# Patient Record
Sex: Female | Born: 1960
Health system: Southern US, Community
[De-identification: ages and names within clinical notes are randomized; demographics above are authoritative.]

## PROBLEM LIST (undated history)

## (undated) DIAGNOSIS — M1990 Unspecified osteoarthritis, unspecified site: Secondary | ICD-10-CM

## (undated) DIAGNOSIS — K91840 Postprocedural hemorrhage and hematoma of a digestive system organ or structure following a digestive system procedure: Secondary | ICD-10-CM

## (undated) DIAGNOSIS — I10 Essential (primary) hypertension: Secondary | ICD-10-CM

## (undated) DIAGNOSIS — T7840XA Allergy, unspecified, initial encounter: Secondary | ICD-10-CM

## (undated) HISTORY — DX: Unspecified osteoarthritis, unspecified site: M19.90

## (undated) HISTORY — PX: COLONOSCOPY: SHX174

## (undated) HISTORY — DX: Allergy, unspecified, initial encounter: T78.40XA

## (undated) HISTORY — PX: OTHER SURGICAL HISTORY: SHX169

## (undated) HISTORY — PX: INCONTINENCE SURGERY: SHX676

## (undated) HISTORY — DX: Essential (primary) hypertension: I10

---
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
## 2005-05-28 HISTORY — PX: BLADDER REPAIR: SHX76

## 2013-10-23 ENCOUNTER — Encounter: Payer: Self-pay | Admitting: Physician Assistant

## 2013-10-23 ENCOUNTER — Ambulatory Visit (INDEPENDENT_AMBULATORY_CARE_PROVIDER_SITE_OTHER): Payer: 59 | Admitting: Physician Assistant

## 2013-10-23 VITALS — BP 143/78 | HR 70 | Ht 62.0 in | Wt 165.0 lb

## 2013-10-23 DIAGNOSIS — F329 Major depressive disorder, single episode, unspecified: Secondary | ICD-10-CM

## 2013-10-23 DIAGNOSIS — I10 Essential (primary) hypertension: Secondary | ICD-10-CM

## 2013-10-23 DIAGNOSIS — F32A Depression, unspecified: Secondary | ICD-10-CM

## 2013-10-23 DIAGNOSIS — J309 Allergic rhinitis, unspecified: Secondary | ICD-10-CM

## 2013-10-23 DIAGNOSIS — F439 Reaction to severe stress, unspecified: Secondary | ICD-10-CM

## 2013-10-23 DIAGNOSIS — Z1211 Encounter for screening for malignant neoplasm of colon: Secondary | ICD-10-CM

## 2013-10-23 DIAGNOSIS — F3289 Other specified depressive episodes: Secondary | ICD-10-CM

## 2013-10-23 MED ORDER — ESCITALOPRAM OXALATE 10 MG PO TABS: 10.0000 mg | ORAL_TABLET | Freq: Every day | ORAL | Status: DC

## 2013-10-23 MED ORDER — HYDROCHLOROTHIAZIDE 12.5 MG PO TABS: 12.5000 mg | ORAL_TABLET | Freq: Every day | ORAL | Status: DC

## 2013-10-23 MED ORDER — CONJ ESTROG-MEDROXYPROGEST ACE 0.625-2.5 MG PO TABS: 1.0000 | ORAL_TABLET | Freq: Every day | ORAL | Status: DC

## 2013-10-23 NOTE — Patient Instructions (Signed)
belviq twice a day.  Qsymia once day.  Contrave twice a day.  Phentermine once a day. Cheapest.   Follow up in 4-6 weeks.

## 2013-10-26 DIAGNOSIS — F329 Major depressive disorder, single episode, unspecified: Secondary | ICD-10-CM | POA: Insufficient documentation

## 2013-10-26 DIAGNOSIS — F32A Depression, unspecified: Secondary | ICD-10-CM | POA: Insufficient documentation

## 2013-10-26 DIAGNOSIS — F439 Reaction to severe stress, unspecified: Secondary | ICD-10-CM | POA: Insufficient documentation

## 2013-10-26 DIAGNOSIS — I10 Essential (primary) hypertension: Secondary | ICD-10-CM | POA: Insufficient documentation

## 2013-10-26 DIAGNOSIS — J309 Allergic rhinitis, unspecified: Secondary | ICD-10-CM | POA: Insufficient documentation

## 2013-10-26 NOTE — Progress Notes (Signed)
Subjective:    Patient ID: Melanie Hart, female    DOB: [birth year], 53 y.o.   MRN: [id number]  HPI Patient is a 53 year old female who presents to the clinic to establish care.  .. Active Ambulatory Problems    Diagnosis Date Noted  . Depression 10/26/2013  . Stress at home 10/26/2013  . Essential hypertension, benign 10/26/2013  . Allergic rhinitis 10/26/2013   Resolved Ambulatory Problems    Diagnosis Date Noted  . No Resolved Ambulatory Problems   No Additional Past Medical History   .[name] Family History  Problem Relation Age of Onset  . Hypertension Mother   . Hypertension Sister   . Hyperlipidemia Brother   . Hypertension Brother    .[name] History   Social History  . Marital Status: Married    Spouse Name: N/A    Number of Children: N/A  . Years of Education: N/A   Occupational History  . Not on file.   Social History Main Topics  . Smoking status: Never Smoker   . Smokeless tobacco: Not on file  . Alcohol Use: No  . Drug Use: No  . Sexual Activity: Yes   Other Topics Concern  . Not on file   Social History Narrative  . No narrative on file   Allergic rhinitis-controlled with headache, Flonase and Claritin.  Depression/stress at home-couple years ago patient was struggling with depression and stress. She was put on at the same time estrogen and Lexapro. She has been much improved for the last 2 years. She would like to get off these medications at some point.  Hypertension-she has not currently been on any medication for hypertension. Her blood pressures have been borderline for over 6 months now. She denies any chest pains, palpitations, headaches or vision changes. She is aware that symptoms most likely needs to be done in any future.     Review of Systems  All other systems reviewed and are negative.      Objective:   Physical Exam  Constitutional: She is oriented to person, place, and time. She appears well-developed and well-nourished.   Obese.   HENT:  Head: Normocephalic and atraumatic.  Cardiovascular: Normal rate, regular rhythm and normal heart sounds.   Pulmonary/Chest: Effort normal and breath sounds normal.  Neurological: She is alert and oriented to person, place, and time.  Skin: Skin is dry.  Psychiatric: She has a normal mood and affect. Her behavior is normal.          Assessment & Plan:  Hypertension-blood pressure was slightly elevated today. Patient reported this has been ongoing for a couple months. Will start HCTZ 12.5 mg daily. Will recheck in 4-6 weeks. Patient aware she will urinate more frequently.  Stress at home/depression-controlled at this point. PHQ-9 was 0/2. Refilled Lexapro 10 mg daily. Patient did mention tapering off this medication. Discuss cutting in half and taking one half tab daily for 4-6 weeks. If no change in mood then to stop Lexapro. Patient was also put on estrogen at this time. She is only been on for 2 years. I suggested a least one more year of estrogen replacement then a trial off. If she is hoping to get off both medications I would try one at a time. She would like to try the Lexapro first.  Allergic rhinitis-patient did not need refills today however she was instructed to continue on Flonase and had a day as well as Claritin.  Obesity/abnormal weight gain-discussed options for weight loss. Contrave, belviq,  phentermine, qysmia were discussed. Patient aware of importance of being ready to start these medications for diet and exercise. 1200-calorie diet was discussed along with exercising at least 4-5 times a week. We will discuss in more depth at next visit in 4-6 weeks. I gave her information on all of these options.  Ordered colonoscopy. Patient is aware she needs a Pap smear.

## 2013-11-24 ENCOUNTER — Ambulatory Visit: Payer: 59 | Admitting: Physician Assistant

## 2013-11-24 ENCOUNTER — Encounter: Payer: Self-pay | Admitting: Internal Medicine

## 2013-12-02 ENCOUNTER — Ambulatory Visit (INDEPENDENT_AMBULATORY_CARE_PROVIDER_SITE_OTHER): Payer: 59 | Admitting: Physician Assistant

## 2013-12-02 ENCOUNTER — Encounter: Payer: Self-pay | Admitting: Physician Assistant

## 2013-12-02 VITALS — BP 140/77 | HR 74 | Ht 62.0 in | Wt 166.0 lb

## 2013-12-02 DIAGNOSIS — R635 Abnormal weight gain: Secondary | ICD-10-CM

## 2013-12-02 DIAGNOSIS — E669 Obesity, unspecified: Secondary | ICD-10-CM

## 2013-12-02 DIAGNOSIS — I10 Essential (primary) hypertension: Secondary | ICD-10-CM

## 2013-12-02 MED ORDER — NALTREXONE-BUPROPION HCL ER 8-90 MG PO TB12
ORAL_TABLET | ORAL | Status: DC
Start: 2013-12-02 — End: 2014-01-26

## 2013-12-02 NOTE — Progress Notes (Signed)
   Subjective:    Patient ID: Melanie Hart, female    DOB: 1960-08-17, 53 y.o.   MRN: [id number]  HPI Patient is a 53 year old female who presents to the clinic to followup on hypertension. She was started on HCTZ 12.5 mg daily for blood pressure. She has not noticed any changes in urination or side effects. She has not been checking her blood pressures. She denies any chest pain, palpitations, headaches or vision changes.  She would really like to be on something for weight loss. She continues to gain weight and wants to eat healthy weight. She also felt slightly loss could decrease her blood pressure so she does not have to stay on medication. She is currently not doing anything for diet management or exercise.     Review of Systems  All other systems reviewed and are negative.      Objective:   Physical Exam  Constitutional: She is oriented to person, place, and time. She appears well-developed and well-nourished.  HENT:  Head: Normocephalic and atraumatic.  Cardiovascular: Normal rate, regular rhythm and normal heart sounds.   Pulmonary/Chest: Effort normal and breath sounds normal.  Neurological: She is alert and oriented to person, place, and time.  Skin: Skin is dry.  Psychiatric: She has a normal mood and affect. Her behavior is normal.          Assessment & Plan:  HTN- pt is borderline hypertensive that did not improve with HCTZ. Stopped HCTZ. Encouraged pt to keep a log of BP over next 2 weeks and fax numbers in. Will decided at that point if needs to try another medication. She did not like lisinopril last time she took it. She is hoping to start losing weight and she feels like her BP might decrease accordingly.   Obesity/abnormal weight gain-we discussed all of long term weight loss drugs on the market. I do not think phentermine would be a good choice for patient with a side effect of increased blood pressure and heart rate. Patient has not decided together on contrave  since pt suffers a lot with cravings. Patient was educated on tapering up of medication. Side effects were discussed. Handout was given with information on drug. Patient was encouraged to followup in 2 months to discuss outcomes. Encouraged patient to incorporate healthy diet of around 1500 calories a day along with regular exercise that causes her to be out of breath.  Spent 30 minutes with patient greater than 50% of visit spent counseling patient regarding her treatment plan for weight loss.

## 2014-01-26 ENCOUNTER — Ambulatory Visit (AMBULATORY_SURGERY_CENTER): Payer: Self-pay

## 2014-01-26 VITALS — Ht 62.0 in | Wt 164.4 lb

## 2014-01-26 DIAGNOSIS — Z83719 Family history of colon polyps, unspecified: Secondary | ICD-10-CM

## 2014-01-26 DIAGNOSIS — Z8371 Family history of colonic polyps: Secondary | ICD-10-CM

## 2014-01-26 MED ORDER — MOVIPREP 100 G PO SOLR: 1.0000 | Freq: Once | ORAL | Status: DC

## 2014-01-26 NOTE — Progress Notes (Signed)
No allergies to eggs or soy No past problems with anesthesia No home oxygen No diet/weight loss med  Has email  Emmi instructions given for colonoscopy

## 2014-02-12 ENCOUNTER — Ambulatory Visit (AMBULATORY_SURGERY_CENTER): Payer: 59 | Admitting: Internal Medicine

## 2014-02-12 ENCOUNTER — Encounter: Payer: Self-pay | Admitting: Internal Medicine

## 2014-02-12 VITALS — BP 164/101 | HR 66 | Temp 97.4°F | Resp 23 | Ht 62.0 in | Wt 164.0 lb

## 2014-02-12 DIAGNOSIS — Z1211 Encounter for screening for malignant neoplasm of colon: Secondary | ICD-10-CM

## 2014-02-12 DIAGNOSIS — Z8371 Family history of colonic polyps: Secondary | ICD-10-CM

## 2014-02-12 DIAGNOSIS — D120 Benign neoplasm of cecum: Secondary | ICD-10-CM

## 2014-02-12 DIAGNOSIS — D126 Benign neoplasm of colon, unspecified: Secondary | ICD-10-CM

## 2014-02-12 MED ORDER — SODIUM CHLORIDE 0.9 % IV SOLN: 500.0000 mL | INTRAVENOUS | Status: DC

## 2014-02-12 NOTE — Op Note (Signed)
[facility]  [address] [place], [id number]   COLONOSCOPY PROCEDURE REPORT  PATIENT: Melanie Hart, Melanie Hart  MR#: [id number] BIRTHDATE: 1960/09/16 , 52  yrs. old GENDER: Female ENDOSCOPIST: Jerene Bears, MD REFERRED BY: Iran Planas, PA-C PROCEDURE DATE:  02/12/2014 PROCEDURE:   Colonoscopy with snare polypectomy First Screening Colonoscopy - Avg.  risk and is 50 yrs.  old or older - No.  Prior Negative Screening - Now for repeat screening. N/A  History of Adenoma - Now for follow-up colonoscopy & has been > or = to 3 yrs.  N/A  Polyps Removed Today? Yes. ASA CLASS:   Class II INDICATIONS:average risk screening and first colonoscopy.   family history of colon polyp (mother) MEDICATIONS: MAC sedation, administered by CRNA and propofol (Diprivan) 300mg  IV  DESCRIPTION OF PROCEDURE:   After the risks benefits and alternatives of the procedure were thoroughly explained, informed consent was obtained.  A digital rectal exam revealed no rectal mass.   The LB [id number]-[id number]  endoscope was introduced through the anus and advanced to the cecum, which was identified by both the appendix and ileocecal valve. No adverse events experienced. The quality of the prep was good, using MoviPrep  The instrument was then slowly withdrawn as the colon was fully examined.  COLON FINDINGS: A flat polyp measuring 5-6 mm in size was found at the cecum.  A polypectomy was performed with a cold snare.  The resection was complete and the polyp tissue was completely retrieved.   There was moderate diverticulosis noted in the ascending colon, transverse colon, descending colon, and sigmoid colon with associated muscular hypertrophy.  Retroflexed views revealed internal hemorrhoids. The time to cecum=4 minutes 35 seconds.  Withdrawal time=21 minutes 27 seconds.  The scope was withdrawn and the procedure completed. COMPLICATIONS: There were no complications.  ENDOSCOPIC IMPRESSION: 1.   Flat  polyp measuring 5-6 mm in size was found at the cecum; polypectomy was performed with a cold snare 2.   There was moderate diverticulosis noted in the ascending colon, transverse colon, descending colon, and sigmoid colon  RECOMMENDATIONS: 1.  Await pathology results 2.  High fiber diet 3.  Timing of repeat colonoscopy will be determined by pathology findings. 4.  You will receive a letter within 1-2 weeks with the results of your biopsy as well as final recommendations.  Please call my office if you have not received a letter after 3 weeks.   eSigned:  Jerene Bears, MD 02/12/2014 9:20 AM   cc: The Patient and Iran Planas PA-C

## 2014-02-12 NOTE — Progress Notes (Signed)
Patient states that she is aware of her increased bp.  She states that she is watching it with her PCP and exercising regularly.  She will follow-up with her PCP soon.

## 2014-02-12 NOTE — Patient Instructions (Signed)
YOU HAD AN ENDOSCOPIC PROCEDURE TODAY AT [facility]: Refer to the procedure report that was given to you for any specific questions about what was found during the examination.  If the procedure report does not answer your questions, please call your gastroenterologist to clarify.  If you requested that your care partner not be given the details of your procedure findings, then the procedure report has been included in a sealed envelope for you to review at your convenience later.  YOU SHOULD EXPECT: Some feelings of bloating in the abdomen. Passage of more gas than usual.  Walking can help get rid of the air that was put into your GI tract during the procedure and reduce the bloating. If you had a lower endoscopy (such as a colonoscopy or flexible sigmoidoscopy) you may notice spotting of blood in your stool or on the toilet paper. If you underwent a bowel prep for your procedure, then you may not have a normal bowel movement for a few days.  DIET: Your first meal following the procedure should be a light meal and then it is ok to progress to your normal diet.  A half-sandwich or bowl of soup is an example of a good first meal.  Heavy or fried foods are harder to digest and may make you feel nauseous or bloated.  Likewise meals heavy in dairy and vegetables can cause extra gas to form and this can also increase the bloating.  Drink plenty of fluids but you should avoid alcoholic beverages for 24 hours.Try to eat a high fiber diet due to extensive amounts of Diverticulosis.    ACTIVITY: Your care partner should take you home directly after the procedure.  You should plan to take it easy, moving slowly for the rest of the day.  You can resume normal activity the day after the procedure however you should NOT DRIVE or use heavy machinery for 24 hours (because of the sedation medicines used during the test).    SYMPTOMS TO REPORT IMMEDIATELY: A gastroenterologist can be reached at any hour.   During normal business hours, 8:30 AM to 5:00 PM Monday through Friday, call [phone].  After hours and on weekends, please call the GI answering service at [phone] who will take a message and have the physician on call contact you.   Following lower endoscopy (colonoscopy or flexible sigmoidoscopy):  Excessive amounts of blood in the stool  Significant tenderness or worsening of abdominal pains  Swelling of the abdomen that is new, acute  Fever of 100F or higher  FOLLOW UP: If any biopsies were taken you will be contacted by phone or by letter within the next 1-3 weeks.  Call your gastroenterologist if you have not heard about the biopsies in 3 weeks.  Our staff will call the home number listed on your records the next business day following your procedure to check on you and address any questions or concerns that you may have at that time regarding the information given to you following your procedure. This is a courtesy call and so if there is no answer at the home number and we have not heard from you through the emergency physician on call, we will assume that you have returned to your regular daily activities without incident.  SIGNATURES/CONFIDENTIALITY: You and/or your care partner have signed paperwork which will be entered into your electronic medical record.  These signatures attest to the fact that that the information above on your After Visit Summary  has been reviewed and is understood.  Full responsibility of the confidentiality of this discharge information lies with you and/or your care-partner.  Please, read the handouts on polyps and diverticulosis given to you by your recovery room nurse.

## 2014-02-12 NOTE — Progress Notes (Signed)
Called to room to assist during endoscopic procedure.  Patient ID and intended procedure confirmed with present staff. Received instructions for my participation in the procedure from the performing physician.  

## 2014-02-12 NOTE — Progress Notes (Signed)
Procedure ends, to recovery, report given and VSS. 

## 2014-02-15 ENCOUNTER — Telehealth: Payer: Self-pay | Admitting: *Deleted

## 2014-02-15 NOTE — Telephone Encounter (Signed)
  Follow up Call-  Call back number 02/12/2014  Post procedure Call Back phone  # [phone]  Permission to leave phone message Yes     Patient questions:  Do you have a fever, pain , or abdominal swelling? No. Pain Score  0 *  Have you tolerated food without any problems? Yes.    Have you been able to return to your normal activities? Yes.    Do you have any questions about your discharge instructions: Diet   No. Medications  No. Follow up visit  No.  Do you have questions or concerns about your Care? No.  Actions: * If pain score is 4 or above: No action needed, pain <4.

## 2014-02-17 ENCOUNTER — Encounter: Payer: Self-pay | Admitting: Internal Medicine

## 2014-02-19 ENCOUNTER — Encounter: Payer: Self-pay | Admitting: Physician Assistant

## 2014-02-19 ENCOUNTER — Ambulatory Visit (INDEPENDENT_AMBULATORY_CARE_PROVIDER_SITE_OTHER): Payer: 59 | Admitting: Physician Assistant

## 2014-02-19 VITALS — BP 156/85 | HR 85 | Ht 62.0 in | Wt 163.0 lb

## 2014-02-19 DIAGNOSIS — E663 Overweight: Secondary | ICD-10-CM | POA: Insufficient documentation

## 2014-02-19 DIAGNOSIS — I10 Essential (primary) hypertension: Secondary | ICD-10-CM

## 2014-02-19 DIAGNOSIS — Z13220 Encounter for screening for lipoid disorders: Secondary | ICD-10-CM

## 2014-02-19 DIAGNOSIS — Z131 Encounter for screening for diabetes mellitus: Secondary | ICD-10-CM

## 2014-02-19 MED ORDER — LOSARTAN POTASSIUM-HCTZ 50-12.5 MG PO TABS: 1.0000 | ORAL_TABLET | Freq: Every day | ORAL | Status: DC

## 2014-02-19 NOTE — Progress Notes (Signed)
   Subjective:    Patient ID: Melanie Hart, female    DOB: 04/16/1961, 53 y.o.   MRN: [id number]  HPI Pt presents to the clinic to follow up on HTN. Doing DASH diet and working on weight loss with little benefit. Had to stop contrave due to dry mouth. Will continue exercise and diet changes. Not checking BP.    Review of Systems  All other systems reviewed and are negative.      Objective:   Physical Exam  Constitutional: She is oriented to person, place, and time. She appears well-developed and well-nourished.  HENT:  Head: Normocephalic and atraumatic.  Cardiovascular: Normal rate, regular rhythm and normal heart sounds.   Pulmonary/Chest: Effort normal and breath sounds normal.  Neurological: She is alert and oriented to person, place, and time.  Skin: Skin is dry.  Psychiatric: She has a normal mood and affect. Her behavior is normal.          Assessment & Plan:  HTN- started hyzaar daily. Did not tolerate lisinopril.  Follow up in 4-6 weeks. Continue DASH diet and work on weight loss.   Abnormal weight loss/overweight- discussed weight watches and calorie counting. Discussed portion control and other ways of weight loss. Encouraged exercise.   Needs CPE with fasting labs. Given lab slip to have drawn before CPE.

## 2014-04-30 ENCOUNTER — Ambulatory Visit (INDEPENDENT_AMBULATORY_CARE_PROVIDER_SITE_OTHER): Payer: 59 | Admitting: Physician Assistant

## 2014-04-30 ENCOUNTER — Other Ambulatory Visit (HOSPITAL_COMMUNITY)
Admission: RE | Admit: 2014-04-30 | Discharge: 2014-04-30 | Disposition: A | Discharge status: Home / Self Care | Payer: 59 | Source: Ambulatory Visit | Attending: Physician Assistant | Admitting: Physician Assistant

## 2014-04-30 ENCOUNTER — Encounter: Payer: Self-pay | Admitting: Physician Assistant

## 2014-04-30 VITALS — BP 120/63 | HR 84 | Ht 62.0 in | Wt 170.0 lb

## 2014-04-30 DIAGNOSIS — Z0000 Encounter for general adult medical examination without abnormal findings: Secondary | ICD-10-CM

## 2014-04-30 DIAGNOSIS — E663 Overweight: Secondary | ICD-10-CM

## 2014-04-30 DIAGNOSIS — Z1151 Encounter for screening for human papillomavirus (HPV): Secondary | ICD-10-CM | POA: Insufficient documentation

## 2014-04-30 DIAGNOSIS — Z01419 Encounter for gynecological examination (general) (routine) without abnormal findings: Secondary | ICD-10-CM | POA: Diagnosis present

## 2014-04-30 DIAGNOSIS — E781 Pure hyperglyceridemia: Secondary | ICD-10-CM

## 2014-04-30 DIAGNOSIS — I10 Essential (primary) hypertension: Secondary | ICD-10-CM

## 2014-04-30 LAB — LIPID PANEL
CHOL/HDL RATIO: 4.7 ratio
CHOLESTEROL: 218 mg/dL — AB (ref 0–200)
HDL: 46 mg/dL (ref >39)
LDL CALC: 102 mg/dL — AB (ref 0–99)
TRIGLYCERIDES: 351 mg/dL — AB (ref <150)
VLDL: 70 mg/dL — AB (ref 0–40)

## 2014-04-30 LAB — COMPLETE METABOLIC PANEL WITH GFR
ALK PHOS: 74 U/L (ref 39–117)
ALT: 16 U/L (ref 0–35)
AST: 17 U/L (ref 0–37)
Albumin: 3.7 g/dL (ref 3.5–5.2)
BILIRUBIN TOTAL: 0.4 mg/dL (ref 0.2–1.2)
BUN: 15 mg/dL (ref 6–23)
CO2: 28 mEq/L (ref 19–32)
Calcium: 9.7 mg/dL (ref 8.4–10.5)
Chloride: 103 mEq/L (ref 96–112)
Creat: 0.71 mg/dL (ref 0.50–1.10)
GFR, Est African American: >89 mL/min
GFR, Est Non African American: >89 mL/min
Glucose, Bld: 95 mg/dL (ref 70–99)
Potassium: 4.8 mEq/L (ref 3.5–5.3)
SODIUM: 141 meq/L (ref 135–145)
TOTAL PROTEIN: 6.8 g/dL (ref 6.0–8.3)

## 2014-05-02 DIAGNOSIS — E781 Pure hyperglyceridemia: Secondary | ICD-10-CM | POA: Insufficient documentation

## 2014-05-02 NOTE — Progress Notes (Signed)
  Subjective:     Melanie Hart is a 53 y.o. female and is here for a comprehensive physical exam. The patient reports no problems.  History   Social History  . Marital Status: Married    Spouse Name: N/A    Number of Children: N/A  . Years of Education: N/A   Occupational History  . Not on file.   Social History Main Topics  . Smoking status: Never Smoker   . Smokeless tobacco: Never Used  . Alcohol Use: Yes     Comment: once yearly  . Drug Use: No  . Sexual Activity: Yes   Other Topics Concern  . Not on file   Social History Narrative   Health Maintenance  Topic Date Due  . INFLUENZA VACCINE  12/26/2013  . MAMMOGRAM  02/04/2015  . PAP SMEAR  09/10/2015  . COLONOSCOPY  02/13/2019  . TETANUS/TDAP  08/26/2021    The following portions of the patient's history were reviewed and updated as appropriate: allergies, current medications, past family history, past medical history, past social history, past surgical history and problem list.  Review of Systems A comprehensive review of systems was negative.   Objective:    BP 120/63 mmHg  Pulse 84  Ht 5\' 2"  (1.575 m)  Wt 170 lb (77.111 kg)  BMI 31.09 kg/m2 General appearance: alert, cooperative and appears stated age overweight Head: Normocephalic, without obvious abnormality, atraumatic Eyes: conjunctivae/corneas clear. PERRL, EOM's intact. Fundi benign. Ears: normal TM's and external ear canals both ears Nose: Nares normal. Septum midline. Mucosa normal. No drainage or sinus tenderness. Throat: lips, mucosa, and tongue normal; teeth and gums normal Neck: no adenopathy, no carotid bruit, no JVD, supple, symmetrical, trachea midline and thyroid not enlarged, symmetric, no tenderness/mass/nodules Back: symmetric, no curvature. ROM normal. No CVA tenderness. Lungs: clear to auscultation bilaterally Heart: regular rate and rhythm, S1, S2 normal, no murmur, click, rub or gallop Abdomen: soft, non-tender; bowel sounds  normal; no masses,  no organomegaly Pelvic: cervix normal in appearance, external genitalia normal, no adnexal masses or tenderness, no cervical motion tenderness, uterus normal size, shape, and consistency and vagina normal without discharge Extremities: extremities normal, atraumatic, no cyanosis or edema Pulses: 2+ and symmetric Skin: Skin color, texture, turgor normal. No rashes or lesions Lymph nodes: Cervical, supraclavicular, and axillary nodes normal. Neurologic: Grossly normal    Assessment:    Healthy female exam.      Plan:    CPE- discussed labs. Vaccines up to date. Pap smear done. Recommended calcium 1200mg  and 800units daily.   hypertriglyceridemia just barely elevated. Discussed diet and exercise changes first. Consider fish oil 2000-4000mg  daily.   HTN- refilled for 6 months.   Overweight- discussed weight loss diet and exercise plan. Follow up if would like to discuss medication.  See After Visit Summary for Counseling Recommendations

## 2014-05-04 ENCOUNTER — Encounter: Payer: Self-pay | Admitting: Physician Assistant

## 2014-05-04 LAB — CYTOLOGY - PAP

## 2014-06-21 ENCOUNTER — Encounter: Payer: Self-pay | Admitting: Physician Assistant

## 2014-10-19 ENCOUNTER — Ambulatory Visit (INDEPENDENT_AMBULATORY_CARE_PROVIDER_SITE_OTHER): Payer: 59 | Admitting: Family Medicine

## 2014-10-19 ENCOUNTER — Encounter: Payer: Self-pay | Admitting: Family Medicine

## 2014-10-19 VITALS — BP 130/84 | HR 84 | Ht 62.0 in | Wt 168.0 lb

## 2014-10-19 DIAGNOSIS — L821 Other seborrheic keratosis: Secondary | ICD-10-CM

## 2014-10-19 DIAGNOSIS — L820 Inflamed seborrheic keratosis: Secondary | ICD-10-CM

## 2014-10-19 NOTE — Progress Notes (Signed)
CC: Melanie Hart is a 54 y.o. female is here for spot of face   Subjective: HPI:  skin lesion left cheek that has been present for a few weeks. Over the last week it seems to have doubled in size every few days. It's slightly tender to the touch. No interventions as of yet. She reports long history of sun exposure in the past but none recently. Denies unintentional weight loss swollen lymph nodes nor difficulty fighting infections. Pain seems to be improved if she leaves the lesion alone. She denies skin lesions elsewhere.   Review Of Systems Outlined In HPI  Past Medical History  Diagnosis Date  . Hypertension     not currently on meds    Past Surgical History  Procedure Laterality Date  . Incontinence surgery     Family History  Problem Relation Age of Onset  . Hypertension Mother   . Colon cancer Mother     unsure if CA or pre-cancerous polyp  . Hypertension Sister   . Hyperlipidemia Brother   . Hypertension Brother     History   Social History  . Marital Status: Married    Spouse Name: N/A  . Number of Children: N/A  . Years of Education: N/A   Occupational History  . Not on file.   Social History Main Topics  . Smoking status: Never Smoker   . Smokeless tobacco: Never Used  . Alcohol Use: Yes     Comment: once yearly  . Drug Use: No  . Sexual Activity: Yes   Other Topics Concern  . Not on file   Social History Narrative     Objective: BP 130/84 mmHg  Pulse 84  Ht 5\' 2"  (1.575 m)  Wt 168 lb (76.204 kg)  BMI 30.72 kg/m2  Vital signs reviewed. General: Alert and Oriented, No Acute Distress HEENT: Pupils equal, round, reactive to light. Conjunctivae clear.  External ears unremarkable.  Moist mucous membranes. Lungs: Clear and comfortable work of breathing, speaking in full sentences without accessory muscle use. Cardiac: Regular rate and rhythm.  Neuro: CN II-XII grossly intact, gait normal. Extremities: No peripheral edema.  Strong peripheral  pulses.  Mental Status: No depression, anxiety, nor agitation. Logical though process. Skin: Warm and dry. 0.5x 1.0 cm diameter waxy fleshy colored lesion on the left cheek slightly raised.  Assessment & Plan: Melanie Hart was seen today for spot of face.  Diagnoses and all orders for this visit:  Seborrheic keratoses  Inflamed seborrheic keratosis of left cheek   Discussed benign nature of seborrheic keratosis on the left cheek and that nothing absolutely needs to be done however if it's painful cryotherapy for destruction is a option. She would prefer to have it destructed today with cryotherapy. I've asked her to come back in one week if the lesion does not appear to start to slough off, I would not charge her for this visit if needed in the future if were only going to be using cryotherapy.  Return if symptoms worsen or fail to improve.   Cryotherapy Procedure Note  Pre-operative Diagnosis: Inflammed SK  Post-operative Diagnosis: same  Locations: left cheek  Indications: pain  Anesthesia: not required    Procedure Details  History of allergy to iodine: no. Pacemaker? no.  Patient informed of risks (permanent scarring, infection, light or dark discoloration, bleeding, infection, weakness, numbness and recurrence of the lesion) and benefits of the procedure and verbal informed consent obtained.  The areas are treated with liquid nitrogen therapy,  frozen until ice ball extended 3 mm beyond lesion, allowed to thaw, and treated again. The patient tolerated procedure well.  The patient was instructed on post-op care, warned that there may be blister formation, redness and pain. Recommend OTC analgesia as needed for pain.  Condition: Stable  Complications: none.  Plan: 1. Instructed to keep the area dry and covered for 24-48h and clean thereafter. 2. Warning signs of infection were reviewed.   3. Recommended that the patient use OTC analgesics as needed for pain.  4. Return  PRN

## 2014-11-16 ENCOUNTER — Other Ambulatory Visit: Payer: Self-pay | Admitting: Physician Assistant

## 2015-01-05 ENCOUNTER — Other Ambulatory Visit: Payer: Self-pay

## 2015-01-05 DIAGNOSIS — Z1211 Encounter for screening for malignant neoplasm of colon: Secondary | ICD-10-CM

## 2015-01-05 NOTE — Progress Notes (Signed)
Pt called requesting labs be ordered before her annual exam on 01/21/2015. Labs ordered.

## 2015-01-20 LAB — LIPID PANEL
Cholesterol: 231 mg/dL — ABNORMAL HIGH (ref 125–200)
HDL: 37 mg/dL — ABNORMAL LOW (ref >=46)
LDL Cholesterol: 121 mg/dL (ref <130)
Total CHOL/HDL Ratio: 6.2 Ratio — ABNORMAL HIGH (ref <=5.0)
Triglycerides: 363 mg/dL — ABNORMAL HIGH (ref <150)
VLDL: 73 mg/dL — ABNORMAL HIGH (ref <30)

## 2015-01-20 LAB — COMPLETE METABOLIC PANEL WITH GFR
ALBUMIN: 3.7 g/dL (ref 3.6–5.1)
ALK PHOS: 61 U/L (ref 33–130)
ALT: 18 U/L (ref 6–29)
AST: 19 U/L (ref 10–35)
BILIRUBIN TOTAL: 0.4 mg/dL (ref 0.2–1.2)
BUN: 13 mg/dL (ref 7–25)
CO2: 24 mmol/L (ref 20–31)
CREATININE: 0.61 mg/dL (ref 0.50–1.05)
Calcium: 9.2 mg/dL (ref 8.6–10.4)
Chloride: 102 mmol/L (ref 98–110)
GFR, Est African American: >89 mL/min (ref >=60)
GFR, Est Non African American: >89 mL/min (ref >=60)
GLUCOSE: 86 mg/dL (ref 65–99)
Potassium: 4.2 mmol/L (ref 3.5–5.3)
SODIUM: 139 mmol/L (ref 135–146)
TOTAL PROTEIN: 6.5 g/dL (ref 6.1–8.1)

## 2015-01-21 ENCOUNTER — Ambulatory Visit (INDEPENDENT_AMBULATORY_CARE_PROVIDER_SITE_OTHER): Payer: 59 | Admitting: Physician Assistant

## 2015-01-21 ENCOUNTER — Encounter: Payer: Self-pay | Admitting: Physician Assistant

## 2015-01-21 VITALS — BP 135/79 | HR 99 | Ht 62.0 in | Wt 167.0 lb

## 2015-01-21 DIAGNOSIS — I10 Essential (primary) hypertension: Secondary | ICD-10-CM | POA: Diagnosis not present

## 2015-01-21 DIAGNOSIS — H532 Diplopia: Secondary | ICD-10-CM | POA: Diagnosis not present

## 2015-01-21 DIAGNOSIS — Z0000 Encounter for general adult medical examination without abnormal findings: Secondary | ICD-10-CM

## 2015-01-21 DIAGNOSIS — R000 Tachycardia, unspecified: Secondary | ICD-10-CM | POA: Diagnosis not present

## 2015-01-21 DIAGNOSIS — E781 Pure hyperglyceridemia: Secondary | ICD-10-CM

## 2015-01-21 DIAGNOSIS — Z23 Encounter for immunization: Secondary | ICD-10-CM | POA: Diagnosis not present

## 2015-01-21 DIAGNOSIS — I4581 Long QT syndrome: Secondary | ICD-10-CM

## 2015-01-21 DIAGNOSIS — R9431 Abnormal electrocardiogram [ECG] [EKG]: Secondary | ICD-10-CM

## 2015-01-21 MED ORDER — ICOSAPENT ETHYL 1 G PO CAPS: ORAL_CAPSULE | ORAL | Status: DC

## 2015-01-21 NOTE — Patient Instructions (Signed)
Stop OTC fish oil. Start vascepa.   Keeping You Healthy  Get These Tests  Blood Pressure- Have your blood pressure checked by your healthcare provider at least once a year.  Normal blood pressure is 120/80.  Weight- Have your body mass index (BMI) calculated to screen for obesity.  BMI is a measure of body fat based on height and weight.  You can calculate your own BMI at GravelBags.it  Cholesterol- Have your cholesterol checked every year.  Diabetes- Have your blood sugar checked every year if you have high blood pressure, high cholesterol, a family history of diabetes or if you are overweight.  Pap Test - Have a pap test every 1 to 5 years if you have been sexually active.  If you are older than 65 and recent pap tests have been normal you may not need additional pap tests.  In addition, if you have had a hysterectomy  for benign disease additional pap tests are not necessary.  Mammogram-Yearly mammograms are essential for early detection of breast cancer  Screening for Colon Cancer- Colonoscopy starting at age 73. Screening may begin sooner depending on your family history and other health conditions.  Follow up colonoscopy as directed by your Gastroenterologist.  Screening for Osteoporosis- Screening begins at age 50 with bone density scanning, sooner if you are at higher risk for developing Osteoporosis.  Get these medicines  Calcium with Vitamin D- Your body requires 1200-1500 mg of Calcium a day and [id number] IU of Vitamin D a day.  You can only absorb 500 mg of Calcium at a time therefore Calcium must be taken in 2 or 3 separate doses throughout the day.  Hormones- Hormone therapy has been associated with increased risk for certain cancers and heart disease.  Talk to your healthcare provider about if you need relief from menopausal symptoms.  Aspirin- Ask your healthcare provider about taking Aspirin to prevent Heart Disease and Stroke.  Get these Immuniztions  Flu  shot- Every fall  Pneumonia shot- Once after the age of 80; if you are younger ask your healthcare provider if you need a pneumonia shot.  Tetanus- Every ten years.  Zostavax- Once after the age of 66 to prevent shingles.  Take these steps  Don't smoke- Your healthcare provider can help you quit. For tips on how to quit, ask your healthcare provider or go to www.smokefree.gov or call 1-800 QUIT-NOW.  Be physically active- Exercise 5 days a week for a minimum of 30 minutes.  If you are not already physically active, start slow and gradually work up to 30 minutes of moderate physical activity.  Try walking, dancing, bike riding, swimming, etc.  Eat a healthy diet- Eat a variety of healthy foods such as fruits, vegetables, whole grains, low fat milk, low fat cheeses, yogurt, lean meats, chicken, fish, eggs, dried beans, tofu, etc.  For more information go to www.thenutritionsource.org  Dental visit- Brush and floss teeth twice daily; visit your dentist twice a year.  Eye exam- Visit your Optometrist or Ophthalmologist yearly.  Drink alcohol in moderation- Limit alcohol intake to one drink or less a day.  Never drink and drive.  Depression- Your emotional health is as important as your physical health.  If you're feeling down or losing interest in things you normally enjoy, please talk to your healthcare provider.  Seat Belts- can save your life; always wear one  Smoke/Carbon Monoxide detectors- These detectors need to be installed on the appropriate level of your home.  Replace batteries  at least once a year.  Violence- If anyone is threatening or hurting you, please tell your healthcare provider.  Living Will/ Health care power of attorney- Discuss with your healthcare provider and family.

## 2015-01-21 NOTE — Progress Notes (Addendum)
  Subjective:     Melanie Hart is a 54 y.o. female and is here for a comprehensive physical exam. The patient reports problems - having some double vision. followed by opthalmologist. wearing hard contacts to correct. not exactly sure what dx is. .  Social History   Social History  . Marital Status: Married    Spouse Name: N/A  . Number of Children: N/A  . Years of Education: N/A   Occupational History  . Not on file.   Social History Main Topics  . Smoking status: Never Smoker   . Smokeless tobacco: Never Used  . Alcohol Use: Yes     Comment: once yearly  . Drug Use: No  . Sexual Activity: Yes   Other Topics Concern  . Not on file   Social History Narrative   Health Maintenance  Topic Date Due  . Hepatitis C Screening  [birth year]  . HIV Screening  05/13/1978  . INFLUENZA VACCINE  12/27/2014  . MAMMOGRAM  04/29/2016  . COLONOSCOPY  02/13/2019  . TETANUS/TDAP  08/26/2021    The following portions of the patient's history were reviewed and updated as appropriate: allergies, current medications, past family history, past medical history, past social history, past surgical history and problem list.  Review of Systems A comprehensive review of systems was negative.   Objective:    BP 135/79 mmHg  Pulse 99  Ht 5\' 2"  (1.575 m)  Wt 167 lb (75.751 kg)  BMI 30.54 kg/m2 General appearance: alert, cooperative and appears stated age Head: Normocephalic, without obvious abnormality, atraumatic Eyes: conjunctivae/corneas clear. PERRL, EOM's intact. Fundi benign. Ears: normal TM's and external ear canals both ears Nose: Nares normal. Septum midline. Mucosa normal. No drainage or sinus tenderness. Throat: lips, mucosa, and tongue normal; teeth and gums normal Neck: no adenopathy, no carotid bruit, no JVD, supple, symmetrical, trachea midline and thyroid not enlarged, symmetric, no tenderness/mass/nodules Back: symmetric, no curvature. ROM normal. No CVA tenderness. Lungs:  clear to auscultation bilaterally Breasts: normal appearance, no masses or tenderness Heart: regular rate and rhythm, S1, S2 normal, no murmur, click, rub or gallop Abdomen: soft, non-tender; bowel sounds normal; no masses,  no organomegaly Pelvic: cervix normal in appearance, external genitalia normal, no adnexal masses or tenderness, no cervical motion tenderness, uterus normal size, shape, and consistency and vagina normal without discharge Extremities: extremities normal, atraumatic, no cyanosis or edema Pulses: 2+ and symmetric Skin: Skin color, texture, turgor normal. No rashes or lesions Lymph nodes: Cervical, supraclavicular, and axillary nodes normal. Neurologic: Grossly normal    Assessment:    Healthy female exam.      Plan:    CPE-flu shot given. Pap up to date.Mammogram, colonoscopy, screenings up to date. Labs discussed today. See below. Diet and exercise encouraged. Vitamin D 800units and calcium 1500mg  encouraged.   HTN- controlled hyzaar refilled.   hypertriglyceridemia started vascepa daily. LDL still under 130. Discussed diet and exercise.   Tachycardia- rechecked and under 100. Red flags discussed.  EkG SR at 99. No ST elevation or depression. Prolonged QT. She is not on any medications to cause this. Will recheck EKG in 6 months.  See After Visit Summary for Counseling Recommendations

## 2015-01-24 DIAGNOSIS — H532 Diplopia: Secondary | ICD-10-CM | POA: Insufficient documentation

## 2015-01-28 DIAGNOSIS — R9431 Abnormal electrocardiogram [ECG] [EKG]: Secondary | ICD-10-CM | POA: Insufficient documentation

## 2015-02-17 NOTE — Addendum Note (Signed)
Addended by: Narda Rutherford on: 02/17/2015 09:53 AM   Modules accepted: Orders

## 2015-05-30 ENCOUNTER — Other Ambulatory Visit: Payer: Self-pay | Admitting: Physician Assistant

## 2015-05-30 MED FILL — VASCEPA 1 GM CAPSULE: 1 | 30 days supply | Qty: 120 | Fill #3

## 2015-05-31 MED FILL — LOSARTAN-HCTZ 50-12.5 MG TA: 50-12.5 | 90 days supply | Qty: 90 | Fill #0

## 2015-07-08 MED FILL — VASCEPA 1 GM CAPSULE: 1 | 30 days supply | Qty: 120 | Fill #4

## 2015-07-22 ENCOUNTER — Encounter: Payer: Self-pay | Admitting: Physician Assistant

## 2015-07-22 ENCOUNTER — Ambulatory Visit (INDEPENDENT_AMBULATORY_CARE_PROVIDER_SITE_OTHER): Payer: 59 | Admitting: Physician Assistant

## 2015-07-22 VITALS — BP 127/64 | HR 69 | Ht 62.0 in | Wt 170.0 lb

## 2015-07-22 DIAGNOSIS — Z79899 Other long term (current) drug therapy: Secondary | ICD-10-CM

## 2015-07-22 DIAGNOSIS — I4581 Long QT syndrome: Secondary | ICD-10-CM | POA: Diagnosis not present

## 2015-07-22 DIAGNOSIS — E669 Obesity, unspecified: Secondary | ICD-10-CM

## 2015-07-22 DIAGNOSIS — E781 Pure hyperglyceridemia: Secondary | ICD-10-CM

## 2015-07-22 DIAGNOSIS — R9431 Abnormal electrocardiogram [ECG] [EKG]: Secondary | ICD-10-CM

## 2015-07-22 NOTE — Progress Notes (Signed)
   Subjective:    Patient ID: Melanie Hart, female    DOB: 11/07/1960, 55 y.o.   MRN: [id number]  HPI Pt presents to the clinic for follow up. Back in October 2016 QT prolongation noted on EKG with tachycardia. Will recheck today. No CP, palpitations. Headaches, dizziness, SOB.   On vascepa for high triglyerides.     Review of Systems  All other systems reviewed and are negative.      Objective:   Physical Exam  Constitutional: She is oriented to person, place, and time. She appears well-developed and well-nourished.  HENT:  Head: Normocephalic and atraumatic.  Cardiovascular: Normal rate, regular rhythm and normal heart sounds.   Pulmonary/Chest: Effort normal and breath sounds normal. She has no wheezes.  Neurological: She is alert and oriented to person, place, and time.  Skin: Skin is dry.  Psychiatric: She has a normal mood and affect. Her behavior is normal.          Assessment & Plan:  QT prolongation-  EKG today resolved. Likely due to dehydration the day she came in. She also was tachycardic which has resolved.   Hypertriglyceridemia- recheck TG. On vascepa.   Obesity- discussed exercise and diet changes. Discussed medications. SE documented with contrave. She is concerned with meds and there side effects.

## 2015-08-02 MED FILL — VASCEPA 1 GM CAPSULE: 1 | 30 days supply | Qty: 120 | Fill #5

## 2015-08-16 MED FILL — LOSARTAN-HCTZ 50-12.5 MG TA: 50-12.5 | 90 days supply | Qty: 90 | Fill #1

## 2015-08-19 ENCOUNTER — Ambulatory Visit (INDEPENDENT_AMBULATORY_CARE_PROVIDER_SITE_OTHER): Payer: 59 | Admitting: Physician Assistant

## 2015-08-19 ENCOUNTER — Encounter: Payer: Self-pay | Admitting: Physician Assistant

## 2015-08-19 VITALS — BP 135/75 | HR 85 | Ht 62.0 in | Wt 169.0 lb

## 2015-08-19 DIAGNOSIS — N8110 Cystocele, unspecified: Secondary | ICD-10-CM

## 2015-08-19 DIAGNOSIS — R32 Unspecified urinary incontinence: Secondary | ICD-10-CM | POA: Diagnosis not present

## 2015-08-19 DIAGNOSIS — R319 Hematuria, unspecified: Secondary | ICD-10-CM

## 2015-08-19 DIAGNOSIS — N951 Menopausal and female climacteric states: Secondary | ICD-10-CM | POA: Diagnosis not present

## 2015-08-19 DIAGNOSIS — IMO0002 Reserved for concepts with insufficient information to code with codable children: Secondary | ICD-10-CM

## 2015-08-19 LAB — POCT URINALYSIS DIPSTICK
Bilirubin, UA: NEGATIVE
Glucose, UA: NEGATIVE
Ketones, UA: NEGATIVE
Leukocytes, UA: NEGATIVE
Nitrite, UA: NEGATIVE
PH UA: 8.5
PROTEIN UA: NEGATIVE
SPEC GRAV UA: 1.015
UROBILINOGEN UA: 0.2

## 2015-08-19 MED ORDER — ESTRADIOL 0.1 MG/GM VA CREA: 1.0000 | TOPICAL_CREAM | Freq: Every day | VAGINAL | Status: DC

## 2015-08-19 MED FILL — ESTRACE 0.01% CREAM: 0.1 | 42 days supply | Qty: 43 | Fill #0

## 2015-08-19 NOTE — Patient Instructions (Signed)
Off on Tuesday and fridays. And Thursday afternoons.

## 2015-08-19 NOTE — Progress Notes (Signed)
   Subjective:    Patient ID: Melanie Hart, female    DOB: [birth year], 55 y.o.   MRN: [id number]  HPI  Pt is a 55 yo female who presents to the clinic with urinary incontinence. Pt has similar symptoms in 2006 when she had bladder tac surgery by Dr. Tamala Julian at [facility]. Since then she has had some minor bladder leakage and little drips but nothing major until about 6 months ago. She has noticed more and more episodes of incontience. She has had 2 episodes of complete loss of bladder control once while laughing and the other while lifting. She denies any dysuria, flank pain. No fever, chills, n/v/d.   Pt continues to have vaginal dryness. She feels like using a bottle of astro glide every sexual encounter. Would like to try something else.     Review of Systems See HPI.    Objective:   Physical Exam  Constitutional: She is oriented to person, place, and time. She appears well-developed and well-nourished.  HENT:  Head: Normocephalic and atraumatic.  Cardiovascular: Normal rate, regular rhythm and normal heart sounds.   Pulmonary/Chest: Effort normal and breath sounds normal. She has no wheezes.  No CVA tenderness.   Abdominal: Soft. Bowel sounds are normal. She exhibits no distension and no mass. There is no tenderness. There is no rebound and no guarding.  Genitourinary:  Grade 1 cystocele.  Sensation of urgency when pressing up on buldge in vaginal canal.  Neurological: She is alert and oriented to person, place, and time.  Psychiatric: She has a normal mood and affect. Her behavior is normal.          Assessment & Plan:  Cystocele/hematuria/urinary incontinence- UA dipstick positive for trace blood only. Will do microscopic and culture. No sign of infection today. On exam found cystocele grade 1. Will make referral. Discussed medications to see if would help with symptoms vesicare/oxybutin concerned about side effects due to her already dry eyes. We could consider mrybetriq. She does  not want to try medication at this time. kegals could help.   Vaginal dryness- estrace started to decrease to 1-2 times a week as needed for symptom control. Coupon card given.

## 2015-08-20 LAB — URINALYSIS, MICROSCOPIC ONLY
Bacteria, UA: NONE SEEN [HPF]
CASTS: NONE SEEN [LPF]
Crystals: NONE SEEN [HPF]
RBC / HPF: NONE SEEN RBC/HPF (ref <=2)
SQUAMOUS EPITHELIAL / LPF: NONE SEEN [HPF] (ref <=5)
WBC, UA: NONE SEEN WBC/HPF (ref <=5)
Yeast: NONE SEEN [HPF]

## 2015-08-22 DIAGNOSIS — IMO0002 Reserved for concepts with insufficient information to code with codable children: Secondary | ICD-10-CM | POA: Insufficient documentation

## 2015-08-22 DIAGNOSIS — R32 Unspecified urinary incontinence: Secondary | ICD-10-CM | POA: Insufficient documentation

## 2015-08-22 DIAGNOSIS — R319 Hematuria, unspecified: Secondary | ICD-10-CM | POA: Insufficient documentation

## 2015-08-22 DIAGNOSIS — N951 Menopausal and female climacteric states: Secondary | ICD-10-CM | POA: Insufficient documentation

## 2015-08-22 LAB — URINE CULTURE: Colony Count: >=100000

## 2015-08-22 MED ORDER — NITROFURANTOIN MONOHYD MACRO 100 MG PO CAPS: 100.0000 mg | ORAL_CAPSULE | Freq: Two times a day (BID) | ORAL | Status: DC

## 2015-08-22 MED FILL — NITROFURANTOIN MONO-MCR 100: 100 | 7 days supply | Qty: 14 | Fill #0

## 2015-09-02 DIAGNOSIS — N3946 Mixed incontinence: Secondary | ICD-10-CM | POA: Diagnosis not present

## 2015-09-02 DIAGNOSIS — Z0000 Encounter for general adult medical examination without abnormal findings: Secondary | ICD-10-CM | POA: Diagnosis not present

## 2015-09-02 DIAGNOSIS — R350 Frequency of micturition: Secondary | ICD-10-CM | POA: Diagnosis not present

## 2015-09-08 DIAGNOSIS — N3946 Mixed incontinence: Secondary | ICD-10-CM | POA: Diagnosis not present

## 2015-09-12 MED FILL — VASCEPA 1 GM CAPSULE: 1 | 30 days supply | Qty: 120 | Fill #6

## 2015-09-30 DIAGNOSIS — M6281 Muscle weakness (generalized): Secondary | ICD-10-CM | POA: Diagnosis not present

## 2015-09-30 DIAGNOSIS — N3946 Mixed incontinence: Secondary | ICD-10-CM | POA: Diagnosis not present

## 2015-09-30 DIAGNOSIS — R350 Frequency of micturition: Secondary | ICD-10-CM | POA: Diagnosis not present

## 2015-09-30 DIAGNOSIS — R278 Other lack of coordination: Secondary | ICD-10-CM | POA: Diagnosis not present

## 2015-10-14 DIAGNOSIS — N3946 Mixed incontinence: Secondary | ICD-10-CM | POA: Diagnosis not present

## 2015-10-14 DIAGNOSIS — R350 Frequency of micturition: Secondary | ICD-10-CM | POA: Diagnosis not present

## 2015-10-14 DIAGNOSIS — M6281 Muscle weakness (generalized): Secondary | ICD-10-CM | POA: Diagnosis not present

## 2015-10-14 DIAGNOSIS — R278 Other lack of coordination: Secondary | ICD-10-CM | POA: Diagnosis not present

## 2015-10-18 MED FILL — ESTRACE 0.01% CREAM: 0.1 | 42 days supply | Qty: 43 | Fill #1

## 2015-10-18 MED FILL — VASCEPA 1 GM CAPSULE: 1 | 30 days supply | Qty: 120 | Fill #7

## 2015-10-25 DIAGNOSIS — M6281 Muscle weakness (generalized): Secondary | ICD-10-CM | POA: Diagnosis not present

## 2015-10-25 DIAGNOSIS — N3946 Mixed incontinence: Secondary | ICD-10-CM | POA: Diagnosis not present

## 2015-10-25 DIAGNOSIS — R278 Other lack of coordination: Secondary | ICD-10-CM | POA: Diagnosis not present

## 2015-10-25 DIAGNOSIS — R350 Frequency of micturition: Secondary | ICD-10-CM | POA: Diagnosis not present

## 2015-11-04 DIAGNOSIS — H532 Diplopia: Secondary | ICD-10-CM | POA: Diagnosis not present

## 2015-11-18 ENCOUNTER — Other Ambulatory Visit: Payer: Self-pay | Admitting: Physician Assistant

## 2015-11-18 MED FILL — VASCEPA 1 GM CAPSULE: 1 | 30 days supply | Qty: 120 | Fill #8

## 2015-11-18 MED FILL — LOSARTAN-HCTZ 50-12.5 MG TA: 50-12.5 | 90 days supply | Qty: 90 | Fill #0

## 2015-11-21 DIAGNOSIS — H2513 Age-related nuclear cataract, bilateral: Secondary | ICD-10-CM | POA: Diagnosis not present

## 2015-11-21 DIAGNOSIS — H52213 Irregular astigmatism, bilateral: Secondary | ICD-10-CM | POA: Diagnosis not present

## 2015-11-21 DIAGNOSIS — H52203 Unspecified astigmatism, bilateral: Secondary | ICD-10-CM | POA: Diagnosis not present

## 2015-11-21 DIAGNOSIS — H17823 Peripheral opacity of cornea, bilateral: Secondary | ICD-10-CM | POA: Diagnosis not present

## 2015-11-21 DIAGNOSIS — H04123 Dry eye syndrome of bilateral lacrimal glands: Secondary | ICD-10-CM | POA: Diagnosis not present

## 2015-11-21 DIAGNOSIS — H18013 Anterior corneal pigmentations, bilateral: Secondary | ICD-10-CM | POA: Diagnosis not present

## 2015-11-21 DIAGNOSIS — H5213 Myopia, bilateral: Secondary | ICD-10-CM | POA: Diagnosis not present

## 2015-11-21 DIAGNOSIS — H532 Diplopia: Secondary | ICD-10-CM | POA: Diagnosis not present

## 2015-12-03 ENCOUNTER — Emergency Department (INDEPENDENT_AMBULATORY_CARE_PROVIDER_SITE_OTHER): Payer: 59

## 2015-12-03 ENCOUNTER — Emergency Department
Admission: EM | Admit: 2015-12-03 | Discharge: 2015-12-03 | Disposition: A | Discharge status: Home / Self Care | Payer: 59 | Source: Home / Self Care | Attending: Family Medicine | Admitting: Family Medicine

## 2015-12-03 ENCOUNTER — Encounter: Payer: Self-pay | Admitting: Emergency Medicine

## 2015-12-03 DIAGNOSIS — M19042 Primary osteoarthritis, left hand: Secondary | ICD-10-CM

## 2015-12-03 DIAGNOSIS — M7989 Other specified soft tissue disorders: Secondary | ICD-10-CM | POA: Diagnosis not present

## 2015-12-03 DIAGNOSIS — M25541 Pain in joints of right hand: Secondary | ICD-10-CM | POA: Diagnosis not present

## 2015-12-03 DIAGNOSIS — M79641 Pain in right hand: Secondary | ICD-10-CM | POA: Diagnosis not present

## 2015-12-03 IMAGING — DX DG HAND COMPLETE 3+V*R*
3 series · 3 of 3 positions shown · non-contrast
Comparison: None

CLINICAL DATA: RIGHT hand swelling for 1 week, tingling at finger
tips, no known injury, history of hypertension

EXAM:
RIGHT HAND - COMPLETE 3+ VIEW

[hand pa]
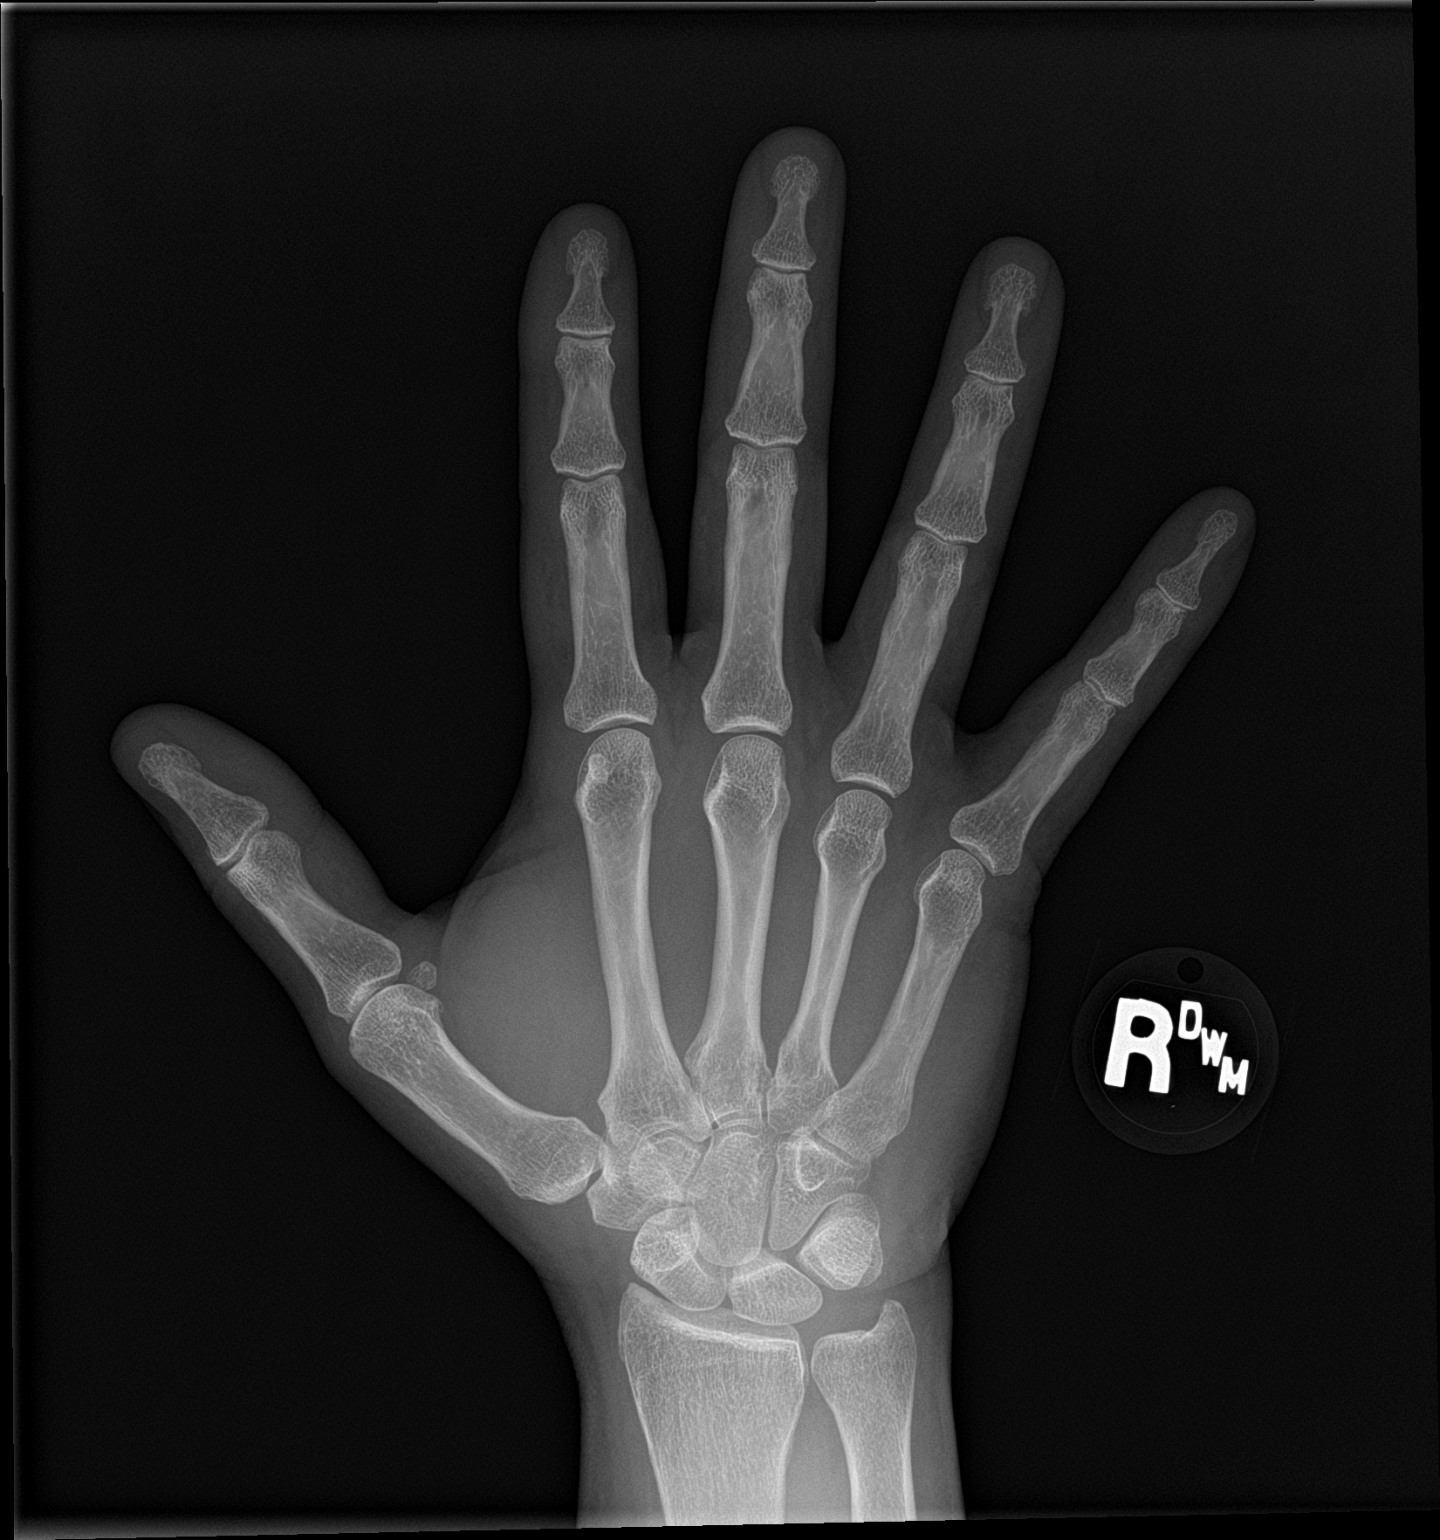

[hand obl]
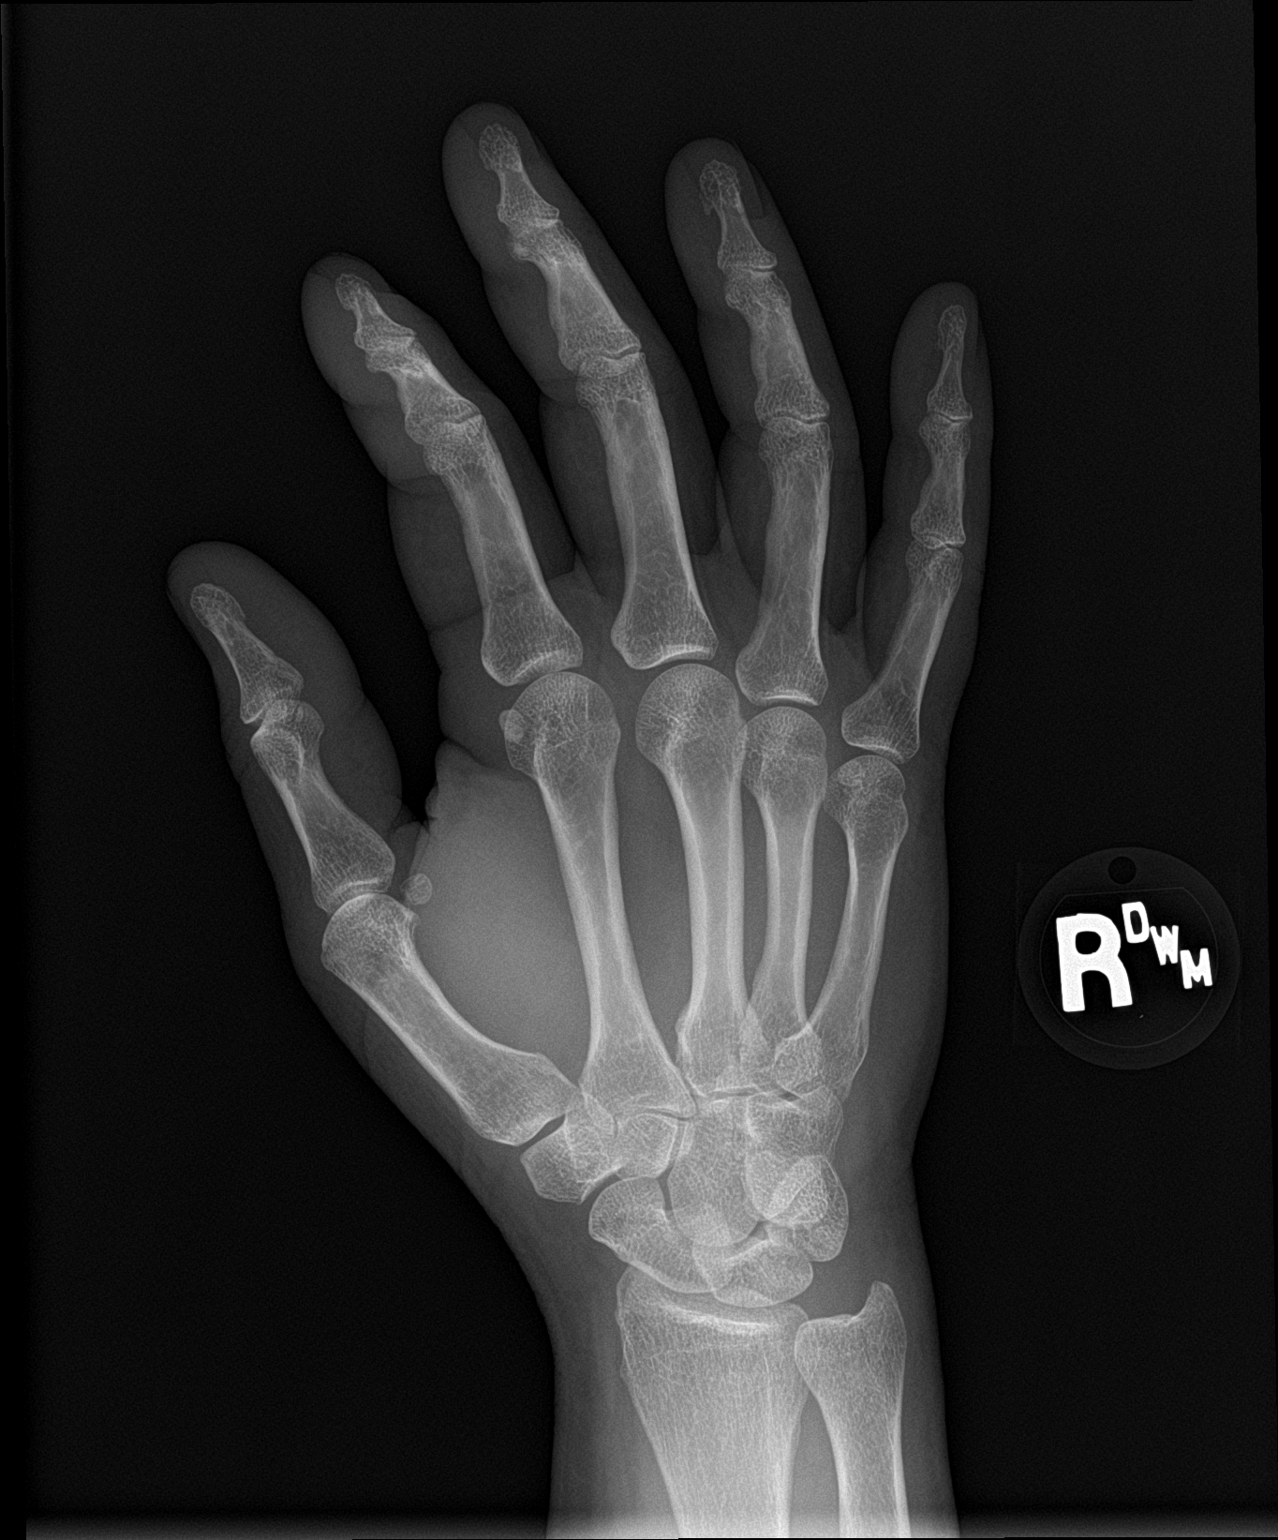

[hand lat]
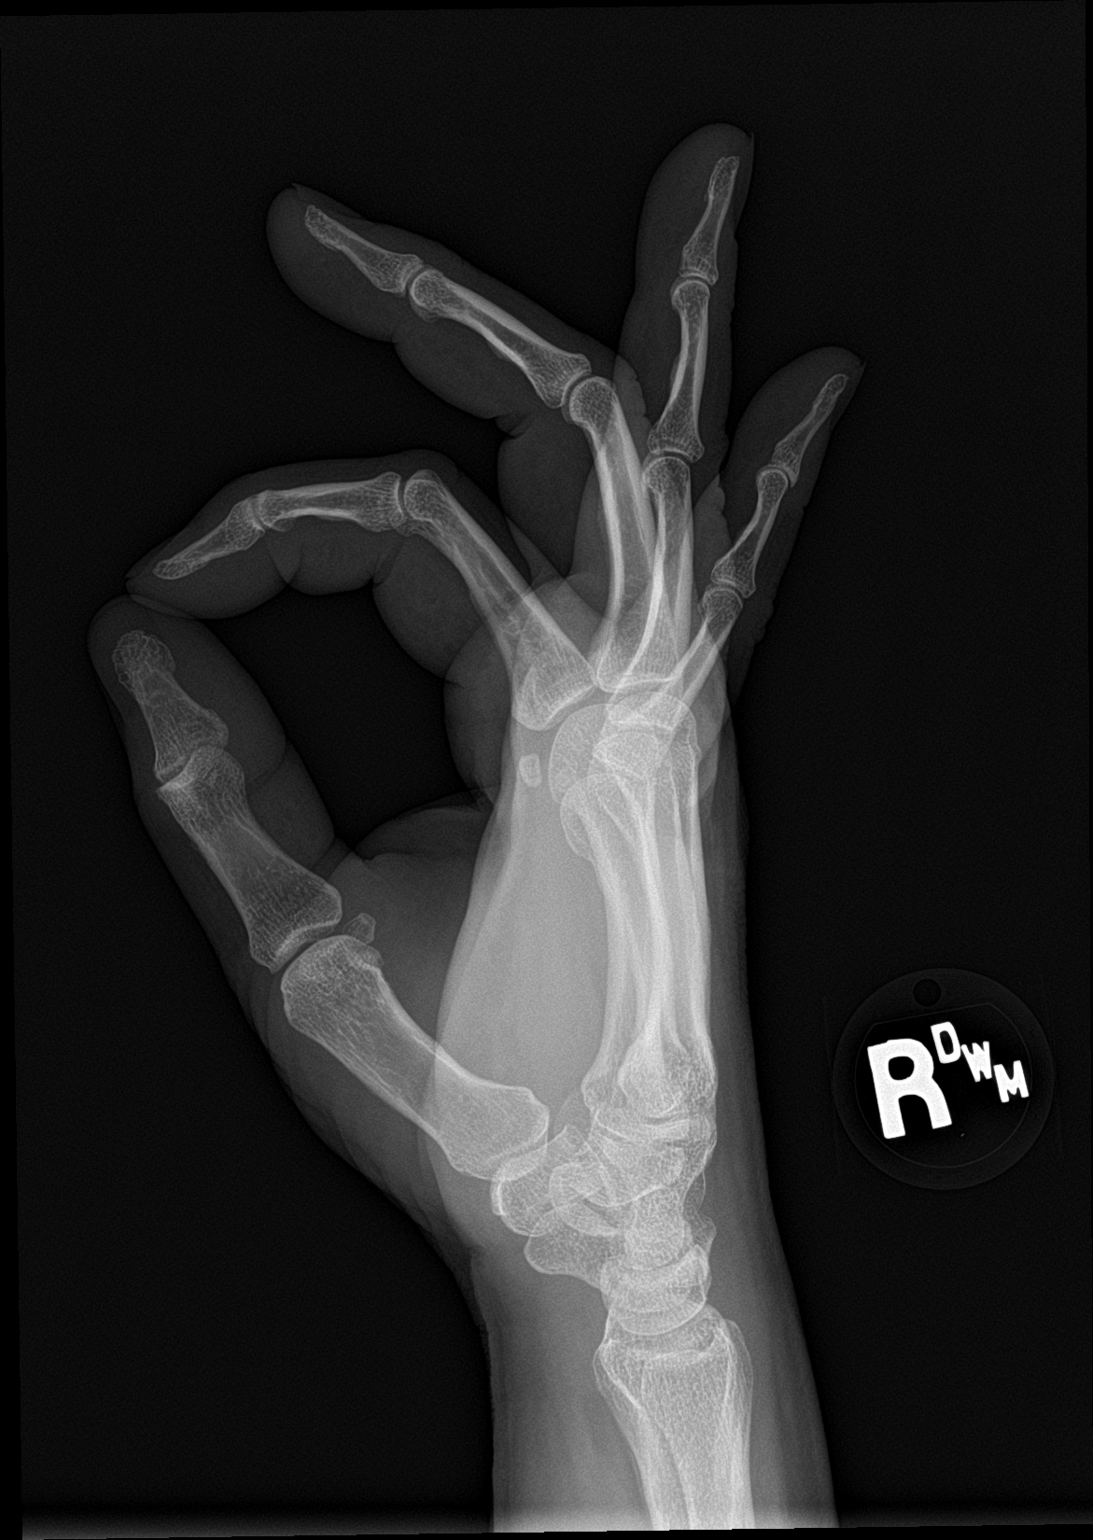

[3 of 3 positions shown; findings below may reference images not displayed]

FINDINGS: Osseous mineralization normal.

Joint spaces preserved.

No fracture, dislocation, or bone destruction.
IMPRESSION: Normal exam.

## 2015-12-03 MED ORDER — DICLOFENAC SODIUM 1 % TD GEL: TRANSDERMAL | Status: DC

## 2015-12-03 MED ORDER — HYDROCODONE-ACETAMINOPHEN 5-325 MG PO TABS: ORAL_TABLET | ORAL | Status: DC

## 2015-12-03 NOTE — ED Notes (Signed)
Pt c./o right hand pain and swelling. Worse at night. Started suddenly x1 week ago. No relief with motrin. Denies injury.

## 2015-12-03 NOTE — ED Provider Notes (Signed)
CSN: [id number]     Arrival date & time 12/03/15  1007 History   First MD Initiated Contact with Patient 12/03/15 1050     Chief Complaint  Patient presents with  . Hand Pain      HPI Comments: Patient suddenly developed diffuse pain and swelling in her right hand about one week ago.  The pain is worse at night.  She recalls no injury.  She has had poor response to ibuprofen.  Patient is a 55 y.o. female presenting with hand pain. The history is provided by the patient.  Hand Pain This is a new problem. Episode onset: 1 week ago. The problem occurs constantly. The problem has not changed since onset.Associated symptoms comments: none. Exacerbated by: grasping with right hand. Nothing relieves the symptoms. Treatments tried: Ibuprofen. The treatment provided no relief.    Past Medical History  Diagnosis Date  . Hypertension     not currently on meds   Past Surgical History  Procedure Laterality Date  . Incontinence surgery     Family History  Problem Relation Age of Onset  . Hypertension Mother   . Colon cancer Mother     unsure if CA or pre-cancerous polyp  . Hypertension Sister   . Hyperlipidemia Brother   . Hypertension Brother    Social History  Substance Use Topics  . Smoking status: Never Smoker   . Smokeless tobacco: Never Used  . Alcohol Use: Yes     Comment: once yearly   OB History    No data available     Review of Systems  All other systems reviewed and are negative.   Allergies  Contrave and Lisinopril  Home Medications   Prior to Admission medications   Medication Sig Start Date End Date Taking? Authorizing Provider  cetirizine (ZYRTEC) 10 MG tablet Take 10 mg by mouth daily.    Historical Provider, MD  diclofenac sodium (VOLTAREN) 1 % GEL Apply to joints BID to TID.  Apply 1 to 2 gm each application [id number]   Kandra Nicolas, MD  estradiol (ESTRACE) 0.1 MG/GM vaginal cream Place 1 Applicatorful vaginally at bedtime. For 2 weeks then decrease to 2-3  times a week as needed or until symptoms controlled. 08/19/15   Donella Stade, PA-C  HYDROcodone-acetaminophen (NORCO/VICODIN) 5-325 MG tablet Take one by mouth at bedtime as needed for pain 12/03/15   Kandra Nicolas, MD  Icosapent Ethyl 1 G CAPS Take 2 tablets twice a day with meals. 01/21/15   Jade L Breeback, PA-C  losartan-hydrochlorothiazide (HYZAAR) 50-12.5 MG tablet TAKE 1 TABLET BY MOUTH DAILY 11/18/15   Jade L Breeback, PA-C  nitrofurantoin, macrocrystal-monohydrate, (MACROBID) 100 MG capsule Take 1 capsule (100 mg total) by mouth 2 (two) times daily. For 7 days. 08/22/15   Jade L Breeback, PA-C  triamcinolone (NASACORT) 55 MCG/ACT AERO nasal inhaler Place 2 sprays into the nose daily.    Historical Provider, MD   Meds Ordered and Administered this Visit  Medications - No data to display  BP 150/86 mmHg  Pulse 63  Temp(Src) 97.8 F (36.6 C) (Oral)  Wt 161 lb (73.029 kg)  SpO2 99% No data found.   Physical Exam  Constitutional: She is oriented to person, place, and time. She appears well-developed and well-nourished. No distress.  HENT:  Head: Normocephalic.  Nose: Nose normal.  Mouth/Throat: Oropharynx is clear and moist.  Eyes: Conjunctivae are normal. Pupils are equal, round, and reactive to light.  Neck: Neck supple.  Cardiovascular: Normal heart sounds.   Pulmonary/Chest: Breath sounds normal.  Abdominal: There is no tenderness.  Musculoskeletal: She exhibits no edema.       Hands: Right hand reveals tenderness to palpation over the DIP, PIP, and MCP joints.   Lymphadenopathy:    She has no cervical adenopathy.  Neurological: She is alert and oriented to person, place, and time.  Skin: Skin is warm and dry. No rash noted.  Nursing note and vitals reviewed.   ED Course  Procedures none   Imaging Review Dg Hand Complete Right  12/03/2015  CLINICAL DATA:  RIGHT hand swelling for 1 week, tingling at finger tips, no known injury, history of hypertension EXAM: RIGHT  HAND - COMPLETE 3+ VIEW COMPARISON:  None FINDINGS: Osseous mineralization normal. Joint spaces preserved. No fracture, dislocation, or bone destruction. IMPRESSION: Normal exam. Electronically Signed   By: Lavonia Dana M.D.   On: 12/03/2015 11:15     MDM   1. Primary osteoarthritis of left hand    Begin trial of Voltaren gel BID to TID.  Recommend heat therapy. Followup with Family Doctor if not improved in two weeks.    Kandra Nicolas, MD 12/12/15 [phone]

## 2015-12-03 NOTE — Discharge Instructions (Signed)
Osteoarthritis Osteoarthritis is a disease that causes soreness and inflammation of a joint. It occurs when the cartilage at the affected joint wears down. Cartilage acts as a cushion, covering the ends of bones where they meet to form a joint. Osteoarthritis is the most common form of arthritis. It often occurs in older people. The joints affected most often by this condition include those in the:  Ends of the fingers.  Thumbs.  Neck.  Lower back.  Knees.  Hips. CAUSES  Over time, the cartilage that covers the ends of bones begins to wear away. This causes bone to rub on bone, producing pain and stiffness in the affected joints.  RISK FACTORS Certain factors can increase your chances of having osteoarthritis, including:  Older age.  Excessive body weight.  Overuse of joints.  Previous joint injury. SIGNS AND SYMPTOMS   Pain, swelling, and stiffness in the joint.  Over time, the joint may lose its normal shape.  Small deposits of bone (osteophytes) may grow on the edges of the joint.  Bits of bone or cartilage can break off and float inside the joint space. This may cause more pain and damage. DIAGNOSIS  Your health care provider will do a physical exam and ask about your symptoms. Various tests may be ordered, such as:  X-rays of the affected joint.  Blood tests to rule out other types of arthritis. Additional tests may be used to diagnose your condition. TREATMENT  Goals of treatment are to control pain and improve joint function. Treatment plans may include:  A prescribed exercise program that allows for rest and joint relief.  A weight control plan.  Pain relief techniques, such as:  Properly applied heat and cold.  Electric pulses delivered to nerve endings under the skin (transcutaneous electrical nerve stimulation [TENS]).  Massage.  Certain nutritional supplements.  Medicines to control pain, such as:  Acetaminophen.  Nonsteroidal  anti-inflammatory drugs (NSAIDs), such as naproxen.  Narcotic or central-acting agents, such as tramadol.  Corticosteroids. These can be given orally or as an injection.  Surgery to reposition the bones and relieve pain (osteotomy) or to remove loose pieces of bone and cartilage. Joint replacement may be needed in advanced states of osteoarthritis. HOME CARE INSTRUCTIONS   Take medicines only as directed by your health care provider.  Maintain a healthy weight. Follow your health care provider's instructions for weight control. This may include dietary instructions.  Exercise as directed. Your health care provider can recommend specific types of exercise. These may include:  Strengthening exercises. These are done to strengthen the muscles that support joints affected by arthritis. They can be performed with weights or with exercise bands to add resistance.  Aerobic activities. These are exercises, such as brisk walking or low-impact aerobics, that get your heart pumping.  Range-of-motion activities. These keep your joints limber.  Balance and agility exercises. These help you maintain daily living skills.  Rest your affected joints as directed by your health care provider.  Keep all follow-up visits as directed by your health care provider. SEEK MEDICAL CARE IF:   Your skin turns red.  You develop a rash in addition to your joint pain.  You have worsening joint pain.  You have a fever along with joint or muscle aches. SEEK IMMEDIATE MEDICAL CARE IF:  You have a significant loss of weight or appetite.  You have night sweats. [place] of Arthritis and Musculoskeletal and Skin Diseases: www.niams.SouthExposed.es  National Institute on  Aging: http://kim-miller.com/  American College of Rheumatology: www.rheumatology.org   This information is not intended to replace advice given to you by your health care provider. Make sure you discuss any questions you  have with your health care provider.   Document Released: 05/14/2005 Document Revised: 06/04/2014 Document Reviewed: 01/19/2013 Elsevier Interactive Patient Education 2016 [place] therapy can help ease sore, stiff, injured, and tight muscles and joints. Heat relaxes your muscles, which may help ease your pain.  RISKS AND COMPLICATIONS If you have any of the following conditions, do not use heat therapy unless your health care provider has approved:  Poor circulation.  Healing wounds or scarred skin in the area being treated.  Diabetes, heart disease, or high blood pressure.  Not being able to feel (numbness) the area being treated.  Unusual swelling of the area being treated.  Active infections.  Blood clots.  Cancer.  Inability to communicate pain. This may include young children and people who have problems with their brain function (dementia).  Pregnancy. Heat therapy should only be used on old, pre-existing, or long-lasting (chronic) injuries. Do not use heat therapy on new injuries unless directed by your health care provider. HOW TO USE HEAT THERAPY There are several different kinds of heat therapy, including:  Moist heat pack.  Warm water bath.  Hot water bottle.  Electric heating pad.  Heated gel pack.  Heated wrap.  Electric heating pad. Use the heat therapy method suggested by your health care provider. Follow your health care provider's instructions on when and how to use heat therapy. GENERAL HEAT THERAPY RECOMMENDATIONS  Do not sleep while using heat therapy. Only use heat therapy while you are awake.  Your skin may turn pink while using heat therapy. Do not use heat therapy if your skin turns red.  Do not use heat therapy if you have new pain.  High heat or long exposure to heat can cause burns. Be careful when using heat therapy to avoid burning your skin.  Do not use heat therapy on areas of your skin that are  already irritated, such as with a rash or sunburn. SEEK MEDICAL CARE IF:  You have blisters, redness, swelling, or numbness.  You have new pain.  Your pain is worse. MAKE SURE YOU:  Understand these instructions.  Will watch your condition.  Will get help right away if you are not doing well or get worse.   This information is not intended to replace advice given to you by your health care provider. Make sure you discuss any questions you have with your health care provider.   Document Released: 08/06/2011 Document Revised: 06/04/2014 Document Reviewed: 07/07/2013 Elsevier Interactive Patient Education [employer].

## 2015-12-26 MED FILL — VASCEPA 1 GM CAPSULE: 1 | 30 days supply | Qty: 120 | Fill #9

## 2016-02-07 ENCOUNTER — Other Ambulatory Visit: Payer: Self-pay | Admitting: Physician Assistant

## 2016-02-07 MED FILL — VASCEPA 1 GM CAPSULE: 1 | 30 days supply | Qty: 120 | Fill #0

## 2016-02-28 MED FILL — LOSARTAN-HCTZ 50-12.5 MG TA: 50-12.5 | 90 days supply | Qty: 90 | Fill #1

## 2016-03-19 MED FILL — VASCEPA 1 GM CAPSULE: 1 | 30 days supply | Qty: 120 | Fill #1

## 2016-04-25 MED FILL — VASCEPA 1 GM CAPSULE: 1 | 30 days supply | Qty: 120 | Fill #2

## 2016-05-15 MED FILL — VASCEPA 1 GM CAPSULE: 1 | 30 days supply | Qty: 120 | Fill #3

## 2016-06-05 ENCOUNTER — Other Ambulatory Visit: Payer: Self-pay | Admitting: Physician Assistant

## 2016-06-07 ENCOUNTER — Telehealth: Payer: Self-pay | Admitting: *Deleted

## 2016-06-07 DIAGNOSIS — I10 Essential (primary) hypertension: Secondary | ICD-10-CM

## 2016-06-07 DIAGNOSIS — E781 Pure hyperglyceridemia: Secondary | ICD-10-CM | POA: Diagnosis not present

## 2016-06-07 MED FILL — LOSARTAN-HCTZ 50-12.5 MG TA: 50-12.5 | 90 days supply | Qty: 90 | Fill #0

## 2016-06-07 NOTE — Telephone Encounter (Signed)
Fasting labs ordered for up coming CPE. °

## 2016-06-08 LAB — LIPID PANEL
CHOLESTEROL: 266 mg/dL — AB (ref <200)
HDL: 39 mg/dL — ABNORMAL LOW (ref >50)
LDL Cholesterol: 183 mg/dL — ABNORMAL HIGH (ref <100)
TRIGLYCERIDES: 221 mg/dL — AB (ref <150)
Total CHOL/HDL Ratio: 6.8 Ratio — ABNORMAL HIGH (ref <5.0)
VLDL: 44 mg/dL — ABNORMAL HIGH (ref <30)

## 2016-06-08 LAB — COMPLETE METABOLIC PANEL WITH GFR
ALBUMIN: 4.1 g/dL (ref 3.6–5.1)
ALK PHOS: 82 U/L (ref 33–130)
ALT: 18 U/L (ref 6–29)
AST: 17 U/L (ref 10–35)
BUN: 16 mg/dL (ref 7–25)
CALCIUM: 9.7 mg/dL (ref 8.6–10.4)
CO2: 27 mmol/L (ref 20–31)
CREATININE: 0.74 mg/dL (ref 0.50–1.05)
Chloride: 105 mmol/L (ref 98–110)
GFR, Est Non African American: >89 mL/min (ref >=60)
Glucose, Bld: 93 mg/dL (ref 65–99)
Potassium: 4.2 mmol/L (ref 3.5–5.3)
Sodium: 140 mmol/L (ref 135–146)
Total Bilirubin: 0.4 mg/dL (ref 0.2–1.2)
Total Protein: 6.8 g/dL (ref 6.1–8.1)

## 2016-06-11 ENCOUNTER — Encounter: Payer: Self-pay | Admitting: Physician Assistant

## 2016-06-11 DIAGNOSIS — E785 Hyperlipidemia, unspecified: Secondary | ICD-10-CM | POA: Insufficient documentation

## 2016-06-12 ENCOUNTER — Ambulatory Visit (INDEPENDENT_AMBULATORY_CARE_PROVIDER_SITE_OTHER): Payer: 59 | Admitting: Physician Assistant

## 2016-06-12 ENCOUNTER — Encounter: Payer: Self-pay | Admitting: Physician Assistant

## 2016-06-12 VITALS — BP 122/70 | HR 86 | Ht 62.0 in | Wt 163.0 lb

## 2016-06-12 DIAGNOSIS — Z0000 Encounter for general adult medical examination without abnormal findings: Secondary | ICD-10-CM

## 2016-06-12 DIAGNOSIS — I10 Essential (primary) hypertension: Secondary | ICD-10-CM

## 2016-06-12 DIAGNOSIS — N951 Menopausal and female climacteric states: Secondary | ICD-10-CM

## 2016-06-12 DIAGNOSIS — E785 Hyperlipidemia, unspecified: Secondary | ICD-10-CM | POA: Diagnosis not present

## 2016-06-12 DIAGNOSIS — M25552 Pain in left hip: Secondary | ICD-10-CM | POA: Insufficient documentation

## 2016-06-12 NOTE — Patient Instructions (Addendum)
Get mammogram.   Tumeric 500mg  twice day  Red Yeast Rice.    Hip Pain Your hip is the joint between your upper legs and your lower pelvis. The bones, cartilage, tendons, and muscles of your hip joint perform a lot of work each day supporting your body weight and allowing you to move around. Hip pain can range from a minor ache to severe pain in one or both of your hips. Pain may be felt on the inside of the hip joint near the groin, or the outside near the buttocks and upper thigh. You may have swelling or stiffness as well. Follow these instructions at home:  Take medicines only as directed by your health care provider.  Apply ice to the injured area:  Put ice in a plastic bag.  Place a towel between your skin and the bag.  Leave the ice on for 15-20 minutes at a time, 3-4 times a day.  Keep your leg raised (elevated) when possible to lessen swelling.  Avoid activities that cause pain.  Follow specific exercises as directed by your health care provider.  Sleep with a pillow between your legs on your most comfortable side.  Record how often you have hip pain, the location of the pain, and what it feels like. Contact a health care provider if:  You are unable to put weight on your leg.  Your hip is red or swollen or very tender to touch.  Your pain or swelling continues or worsens after 1 week.  You have increasing difficulty walking.  You have a fever. Get help right away if:  You have fallen.  You have a sudden increase in pain and swelling in your hip. This information is not intended to replace advice given to you by your health care provider. Make sure you discuss any questions you have with your health care provider. Document Released: 11/01/2009 Document Revised: 10/20/2015 Document Reviewed: 01/08/2013 Elsevier Interactive Patient Education  2017 [employer]  Get These Tests  Blood Pressure- Have your blood pressure checked by  your healthcare provider at least once a year.  Normal blood pressure is 120/80.  Weight- Have your body mass index (BMI) calculated to screen for obesity.  BMI is a measure of body fat based on height and weight.  You can calculate your own BMI at [employer]  Cholesterol- Have your cholesterol checked every year.  Diabetes- Have your blood sugar checked every year if you have high blood pressure, high cholesterol, a family history of diabetes or if you are overweight.  Pap Test - Have a pap test every 1 to 5 years if you have been sexually active.  If you are older than 65 and recent pap tests have been normal you may not need additional pap tests.  In addition, if you have had a hysterectomy  for benign disease additional pap tests are not necessary.  Mammogram-Yearly mammograms are essential for early detection of breast cancer  Screening for Colon Cancer- Colonoscopy starting at age 51. Screening may begin sooner depending on your family history and other health conditions.  Follow up colonoscopy as directed by your Gastroenterologist.  Screening for Osteoporosis- Screening begins at age 55 with bone density scanning, sooner if you are at higher risk for developing Osteoporosis.  Get these medicines  Calcium with Vitamin D- Your body requires 1200-1500 mg of Calcium a day and [phone] IU of Vitamin D a day.  You can only absorb 500 mg of  Calcium at a time therefore Calcium must be taken in 2 or 3 separate doses throughout the day.  Hormones- Hormone therapy has been associated with increased risk for certain cancers and heart disease.  Talk to your healthcare provider about if you need relief from menopausal symptoms.  Aspirin- Ask your healthcare provider about taking Aspirin to prevent Heart Disease and Stroke.  Get these Immuniztions  Flu shot- Every fall  Pneumonia shot- Once after the age of 31; if you are younger ask your healthcare provider if you need a  pneumonia shot.  Tetanus- Every ten years.  Zostavax- Once after the age of 15 to prevent shingles.  Take these steps  Don't smoke- Your healthcare provider can help you quit. For tips on how to quit, ask your healthcare provider or go to www.smokefree.gov or call 1-800 QUIT-NOW.  Be physically active- Exercise 5 days a week for a minimum of 30 minutes.  If you are not already physically active, start slow and gradually work up to 30 minutes of moderate physical activity.  Try walking, dancing, bike riding, swimming, etc.  Eat a healthy diet- Eat a variety of healthy foods such as fruits, vegetables, whole grains, low fat milk, low fat cheeses, yogurt, lean meats, chicken, fish, eggs, dried beans, tofu, etc.  For more information go to www.thenutritionsource.org  Dental visit- Brush and floss teeth twice daily; visit your dentist twice a year.  Eye exam- Visit your Optometrist or Ophthalmologist yearly.  Drink alcohol in moderation- Limit alcohol intake to one drink or less a day.  Never drink and drive.  Depression- Your emotional health is as important as your physical health.  If you're feeling down or losing interest in things you normally enjoy, please talk to your healthcare provider.  Seat Belts- can save your life; always wear one  Smoke/Carbon Monoxide detectors- These detectors need to be installed on the appropriate level of your home.  Replace batteries at least once a year.  Violence- If anyone is threatening or hurting you, please tell your healthcare provider.  Living Will/ Health care power of attorney- Discuss with your healthcare provider and family.

## 2016-06-12 NOTE — Progress Notes (Signed)
Subjective:     Melanie Hart is a 56 y.o. female and is here for a comprehensive physical exam. The patient reports problems - left hip pain and stiffiness. both hip ache but left worse than right. pain worse with external ROM. sitting indian style makes pain worse. not tried anything to make better. .  Social History   Social History  . Marital status: Married    Spouse name: N/A  . Number of children: N/A  . Years of education: N/A   Occupational History  . Not on file.   Social History Main Topics  . Smoking status: Never Smoker  . Smokeless tobacco: Never Used  . Alcohol use Yes     Comment: once yearly  . Drug use: No  . Sexual activity: Yes   Other Topics Concern  . Not on file   Social History Narrative  . No narrative on file   Health Maintenance  Topic Date Due  . MAMMOGRAM  04/29/2016  . Hepatitis C Screening  06/12/2017 (Originally [birth year])  . HIV Screening  06/12/2017 (Originally 05/13/1978)  . PAP SMEAR  04/30/2017  . COLONOSCOPY  02/13/2019  . TETANUS/TDAP  08/26/2021  . INFLUENZA VACCINE  Addressed    The following portions of the patient's history were reviewed and updated as appropriate: allergies, current medications, past family history, past medical history, past social history, past surgical history and problem list.  Review of Systems Pertinent items noted in HPI and remainder of comprehensive ROS otherwise negative.   Objective:    BP 122/70   Pulse 86   Ht 5\' 2"  (1.575 m)   Wt 163 lb (73.9 kg)   BMI 29.81 kg/m  General appearance: alert, cooperative and appears stated age Head: Normocephalic, without obvious abnormality, atraumatic Eyes: conjunctivae/corneas clear. PERRL, EOM's intact. Fundi benign. Ears: normal TM's and external ear canals both ears Nose: Nares normal. Septum midline. Mucosa normal. No drainage or sinus tenderness. Throat: lips, mucosa, and tongue normal; teeth and gums normal Neck: no adenopathy, no carotid  bruit, no JVD, supple, symmetrical, trachea midline and thyroid not enlarged, symmetric, no tenderness/mass/nodules Back: symmetric, no curvature. ROM normal. No CVA tenderness. Lungs: clear to auscultation bilaterally Heart: regular rate and rhythm, S1, S2 normal, no murmur, click, rub or gallop Abdomen: soft, non-tender; bowel sounds normal; no masses,  no organomegaly Extremities: extremities normal, atraumatic, no cyanosis or edema and pain with external ROM of left hip. no tenderness, swelling, erythema over left greater trochanter. no pain with hip flexion.  Pulses: 2+ and symmetric Skin: Skin color, texture, turgor normal. No rashes or lesions Lymph nodes: Cervical, supraclavicular, and axillary nodes normal. Neurologic: Alert and oriented X 3, normal strength and tone. Normal symmetric reflexes. Normal coordination and gait    Assessment:    Healthy female exam.      Plan:  Marland KitchenMarland KitchenArlisha was seen today for annual exam.  Diagnoses and all orders for this visit:  Routine physical examination  Essential hypertension, benign  Vaginal dryness, menopausal  Dyslipidemia  Left hip pain   LDL was 183. Pt declines statin. Pt aware of risk. Discussed low fat diet. Consider starting red yeast rice daily. Continue on vascepa.  Encouraged patient to schedule mammogram.  I suspect left hip pain was due to osteoarthritis. Recommend xrays patient declined. Will try tummeric and/or NSAIDs. Exercises for IT band given to patient.     See After Visit Summary for Counseling Recommendations

## 2016-08-08 MED FILL — VASCEPA 1 GM CAPSULE: 1 | 30 days supply | Qty: 120 | Fill #4

## 2016-09-10 MED FILL — VASCEPA 1 GM CAPSULE: 1 | 30 days supply | Qty: 120 | Fill #5

## 2016-09-10 MED FILL — LOSARTAN-HCTZ 50-12.5 MG TA: 50-12.5 | 90 days supply | Qty: 90 | Fill #1

## 2016-11-26 ENCOUNTER — Other Ambulatory Visit: Payer: Self-pay | Admitting: Physician Assistant

## 2016-11-26 MED FILL — VASCEPA 1 GM CAPSULE: 1 | 30 days supply | Qty: 120 | Fill #6

## 2016-11-27 MED FILL — LOSARTAN POTASSIUM-HCTZ 50-: 50-12.5 | 90 days supply | Qty: 90 | Fill #0

## 2017-01-29 MED FILL — VASCEPA 1 GM CAPSULE: 1 | 30 days supply | Qty: 120 | Fill #7

## 2017-02-22 ENCOUNTER — Other Ambulatory Visit: Payer: Self-pay | Admitting: Physician Assistant

## 2017-02-22 MED FILL — LOSARTAN POTASSIUM-HCTZ 50-: 50-12.5 | 90 days supply | Qty: 90 | Fill #0

## 2017-03-21 ENCOUNTER — Other Ambulatory Visit: Payer: Self-pay | Admitting: Physician Assistant

## 2017-03-21 MED FILL — VASCEPA 1 GM CAPSULE: 1 | 30 days supply | Qty: 120 | Fill #0

## 2017-05-09 ENCOUNTER — Other Ambulatory Visit: Payer: Self-pay | Admitting: Physician Assistant

## 2017-05-10 MED FILL — VASCEPA 1 GM CAPSULE: 1 | 90 days supply | Qty: 360 | Fill #0

## 2017-05-16 ENCOUNTER — Other Ambulatory Visit: Payer: Self-pay | Admitting: Physician Assistant

## 2017-05-17 MED FILL — LOSARTAN POTASSIUM-HCTZ 50-: 50-12.5 | 30 days supply | Qty: 30 | Fill #0

## 2017-07-26 ENCOUNTER — Telehealth: Payer: Self-pay | Admitting: Physician Assistant

## 2017-07-26 DIAGNOSIS — I10 Essential (primary) hypertension: Secondary | ICD-10-CM

## 2017-07-26 DIAGNOSIS — E785 Hyperlipidemia, unspecified: Secondary | ICD-10-CM

## 2017-07-26 DIAGNOSIS — Z0000 Encounter for general adult medical examination without abnormal findings: Secondary | ICD-10-CM

## 2017-07-26 NOTE — Telephone Encounter (Signed)
Labs entered. Left VM for Pt.

## 2017-07-26 NOTE — Telephone Encounter (Signed)
Pt is coming in on march 12th for a cpe and would like to get her blood work done in advance so she can go over the results with [name] while she is here for her physical. Thanks

## 2017-08-02 DIAGNOSIS — Z0000 Encounter for general adult medical examination without abnormal findings: Secondary | ICD-10-CM | POA: Diagnosis not present

## 2017-08-02 DIAGNOSIS — Z1231 Encounter for screening mammogram for malignant neoplasm of breast: Secondary | ICD-10-CM | POA: Diagnosis not present

## 2017-08-02 DIAGNOSIS — I10 Essential (primary) hypertension: Secondary | ICD-10-CM | POA: Diagnosis not present

## 2017-08-02 DIAGNOSIS — E785 Hyperlipidemia, unspecified: Secondary | ICD-10-CM | POA: Diagnosis not present

## 2017-08-02 LAB — CBC WITH DIFFERENTIAL/PLATELET
BASOS PCT: 0.9 %
Basophils Absolute: 50 cells/uL (ref 0–200)
EOS PCT: 1.8 %
Eosinophils Absolute: 101 cells/uL (ref 15–500)
HCT: 38.5 % (ref 35.0–45.0)
Hemoglobin: 13.6 g/dL (ref 11.7–15.5)
Lymphs Abs: 2240 cells/uL (ref 850–3900)
MCH: 29.6 pg (ref 27.0–33.0)
MCHC: 35.3 g/dL (ref 32.0–36.0)
MCV: 83.9 fL (ref 80.0–100.0)
MONOS PCT: 7.7 %
MPV: 10.2 fL (ref 7.5–12.5)
NEUTROS PCT: 49.6 %
Neutro Abs: 2778 cells/uL (ref 1500–7800)
PLATELETS: 257 10*3/uL (ref 140–400)
RBC: 4.59 10*6/uL (ref 3.80–5.10)
RDW: 12.4 % (ref 11.0–15.0)
TOTAL LYMPHOCYTE: 40 %
WBC mixed population: 431 cells/uL (ref 200–950)
WBC: 5.6 10*3/uL (ref 3.8–10.8)

## 2017-08-02 LAB — TSH: TSH: 1.32 mIU/L

## 2017-08-02 LAB — COMPLETE METABOLIC PANEL WITH GFR
AG Ratio: 1.9 (calc) (ref 1.0–2.5)
ALT: 13 U/L (ref 6–29)
AST: 13 U/L (ref 10–35)
Albumin: 4.5 g/dL (ref 3.6–5.1)
Alkaline phosphatase (APISO): 86 U/L (ref 33–130)
BUN: 18 mg/dL (ref 7–25)
CALCIUM: 9.9 mg/dL (ref 8.6–10.4)
CO2: 29 mmol/L (ref 20–32)
Chloride: 102 mmol/L (ref 98–110)
Creat: 0.75 mg/dL (ref 0.50–1.05)
GFR, EST AFRICAN AMERICAN: 105 mL/min/{1.73_m2} (ref >=60)
GFR, EST NON AFRICAN AMERICAN: 90 mL/min/{1.73_m2} (ref >=60)
GLUCOSE: 100 mg/dL — AB (ref 65–99)
Globulin: 2.4 g/dL (calc) (ref 1.9–3.7)
POTASSIUM: 4.3 mmol/L (ref 3.5–5.3)
Sodium: 141 mmol/L (ref 135–146)
TOTAL PROTEIN: 6.9 g/dL (ref 6.1–8.1)
Total Bilirubin: 0.5 mg/dL (ref 0.2–1.2)

## 2017-08-02 LAB — LIPID PANEL
CHOL/HDL RATIO: 5.7 (calc) — AB (ref <5.0)
CHOLESTEROL: 244 mg/dL — AB (ref <200)
HDL: 43 mg/dL — AB (ref >50)
LDL Cholesterol (Calc): 167 mg/dL (calc) — ABNORMAL HIGH
Non-HDL Cholesterol (Calc): 201 mg/dL (calc) — ABNORMAL HIGH (ref <130)
TRIGLYCERIDES: 183 mg/dL — AB (ref <150)

## 2017-08-02 LAB — HM MAMMOGRAPHY

## 2017-08-04 NOTE — Telephone Encounter (Signed)
Call pt: will discussed at CPE. Thyroid looks good. Nothing alarming but cholesterol still not to goal. Will discuss options at C[facility].

## 2017-08-06 ENCOUNTER — Encounter: Payer: Self-pay | Admitting: Physician Assistant

## 2017-08-06 ENCOUNTER — Ambulatory Visit (INDEPENDENT_AMBULATORY_CARE_PROVIDER_SITE_OTHER): Payer: 59 | Admitting: Physician Assistant

## 2017-08-06 ENCOUNTER — Other Ambulatory Visit (HOSPITAL_COMMUNITY)
Admission: RE | Admit: 2017-08-06 | Discharge: 2017-08-06 | Disposition: A | Discharge status: Home / Self Care | Payer: 59 | Source: Ambulatory Visit | Attending: Physician Assistant | Admitting: Physician Assistant

## 2017-08-06 VITALS — BP 125/75 | HR 67 | Ht 62.0 in | Wt 158.0 lb

## 2017-08-06 DIAGNOSIS — L821 Other seborrheic keratosis: Secondary | ICD-10-CM | POA: Diagnosis not present

## 2017-08-06 DIAGNOSIS — Z23 Encounter for immunization: Secondary | ICD-10-CM | POA: Diagnosis not present

## 2017-08-06 DIAGNOSIS — I10 Essential (primary) hypertension: Secondary | ICD-10-CM

## 2017-08-06 DIAGNOSIS — R202 Paresthesia of skin: Secondary | ICD-10-CM

## 2017-08-06 DIAGNOSIS — E785 Hyperlipidemia, unspecified: Secondary | ICD-10-CM | POA: Diagnosis not present

## 2017-08-06 DIAGNOSIS — E781 Pure hyperglyceridemia: Secondary | ICD-10-CM | POA: Diagnosis not present

## 2017-08-06 DIAGNOSIS — E559 Vitamin D deficiency, unspecified: Secondary | ICD-10-CM

## 2017-08-06 DIAGNOSIS — Z0000 Encounter for general adult medical examination without abnormal findings: Secondary | ICD-10-CM | POA: Diagnosis not present

## 2017-08-06 DIAGNOSIS — Z01419 Encounter for gynecological examination (general) (routine) without abnormal findings: Secondary | ICD-10-CM | POA: Diagnosis not present

## 2017-08-06 MED ORDER — ICOSAPENT ETHYL 1 G PO CAPS: ORAL_CAPSULE | ORAL | 3 refills | Status: DC

## 2017-08-06 MED ORDER — LOSARTAN POTASSIUM-HCTZ 50-12.5 MG PO TABS: 1.0000 | ORAL_TABLET | Freq: Every day | ORAL | 3 refills | Status: DC

## 2017-08-06 MED ORDER — AMBULATORY NON FORMULARY MEDICATION: 0 refills | Status: DC

## 2017-08-06 MED FILL — VASCEPA 1 GM CAPSULE: 1 | 90 days supply | Qty: 360 | Fill #0

## 2017-08-06 MED FILL — LOSARTAN POTASSIUM-HCTZ 50-: 50-12.5 | 90 days supply | Qty: 90 | Fill #0

## 2017-08-06 NOTE — Progress Notes (Signed)
Subjective:     Melanie Hart is a 57 y.o. female and is here for a comprehensive physical exam. The patient reports problems - she has had some intermittent numbness and tingling of both lower anterior legs.    Pt does have a SK on her left temple that has popped up similar to a few years ago.   Social History   Socioeconomic History  . Marital status: Married    Spouse name: Not on file  . Number of children: Not on file  . Years of education: Not on file  . Highest education level: Not on file  Social Needs  . Financial resource strain: Not on file  . Food insecurity - worry: Not on file  . Food insecurity - inability: Not on file  . Transportation needs - medical: Not on file  . Transportation needs - non-medical: Not on file  Occupational History  . Not on file  Tobacco Use  . Smoking status: Never Smoker  . Smokeless tobacco: Never Used  Substance and Sexual Activity  . Alcohol use: Yes    Comment: once yearly  . Drug use: No  . Sexual activity: Yes  Other Topics Concern  . Not on file  Social History Narrative  . Not on file   Health Maintenance  Topic Date Due  . Hepatitis C Screening  05-28-1961  . PAP SMEAR  04/30/2017  . HIV Screening  08/07/2018 (Originally 05/13/1976)  . COLONOSCOPY  02/13/2019  . MAMMOGRAM  08/03/2019  . TETANUS/TDAP  08/26/2021  . INFLUENZA VACCINE  Completed    The following portions of the patient's history were reviewed and updated as appropriate: allergies, current medications, past family history, past medical history, past social history, past surgical history and problem list.  Review of Systems Pertinent items noted in HPI and remainder of comprehensive ROS otherwise negative.   Objective:    BP 125/75   Pulse 67   Ht 5\' 2"  (1.575 m)   Wt 158 lb (71.7 kg)   BMI 28.90 kg/m  General appearance: alert, cooperative and appears stated age Head: Normocephalic, without obvious abnormality, atraumatic Eyes:  conjunctivae/corneas clear. PERRL, EOM's intact. Fundi benign. Ears: normal TM's and external ear canals both ears Nose: Nares normal. Septum midline. Mucosa normal. No drainage or sinus tenderness. Throat: lips, mucosa, and tongue normal; teeth and gums normal Neck: no adenopathy, no carotid bruit, no JVD, supple, symmetrical, trachea midline and thyroid not enlarged, symmetric, no tenderness/mass/nodules Back: symmetric, no curvature. ROM normal. No CVA tenderness. Lungs: clear to auscultation bilaterally Heart: regular rate and rhythm, S1, S2 normal, no murmur, click, rub or gallop Abdomen: soft, non-tender; bowel sounds normal; no masses,  no organomegaly Pelvic: cervix normal in appearance, external genitalia normal, no adnexal masses or tenderness, no cervical motion tenderness, uterus normal size, shape, and consistency and vagina normal without discharge Extremities: extremities normal, atraumatic, no cyanosis or edema Pulses: 2+ and symmetric Skin: Skin color, texture, turgor normal. No rashes or lesions brown macule slightly raised with rough wart like appearance over left temple.  Lymph nodes: Cervical, supraclavicular, and axillary nodes normal. Neurologic: Alert and oriented X 3, normal strength and tone. Normal symmetric reflexes. Normal coordination and gait    Assessment:    Healthy female exam.      Plan:  [name] was seen today for annual exam.  Diagnoses and all orders for this visit:  Routine physical examination  Paresthesia -     B12 -  Hepatitis C Antibody -     Vitamin D 1,25 dihydroxy -     TSH  Vitamin D deficiency  Pap smear, as part of routine gynecological examination -     Cytology - PAP  Dyslipidemia  Essential hypertension, benign -     losartan-hydrochlorothiazide (HYZAAR) 50-12.5 MG tablet; Take 1 tablet by mouth daily.  Hypertriglyceridemia -     Icosapent Ethyl (VASCEPA) 1 g CAPS; TAKE 2 CAPSULES BY MOUTH TWICE A DAY WITH  MEALS.  Need for shingles vaccine -     AMBULATORY NON FORMULARY MEDICATION; 2 dose shingrix series for shingles prevention.   .. Depression screen [facility] 2/9 08/06/2017  Decreased Interest 0  Down, Depressed, Hopeless 0  PHQ - 2 Score 0  Altered sleeping 0  Tired, decreased energy 0  Change in appetite 0  Feeling bad or failure about yourself  0  Trouble concentrating 0  Moving slowly or fidgety/restless 1  Suicidal thoughts 0  PHQ-9 Score 1   .. Discussed 150 minutes of exercise a week.  Encouraged vitamin D 1000 units and Calcium 1300mg  or 4 servings of dairy a day.  Mammogram up to date.  Colonoscopy due next year.  Discussed shingles vaccine. Printed rx we do not have any. Needs to go to pharmacy.   Pap done today. Declined STDs.   BP controlled. Refills sent.   Discussed elevated cholesterol. 10 year risk 2.3 percent. Opted for supplements and diet changes. Will continue to monitor.   Paresthesia- unknown etiology. Will do some labs to evaluate. Certainly consider EMG to determine if this is neuropathy. Denies any low back pain. Follow up as needed.   See After Visit Summary for Counseling Recommendations

## 2017-08-06 NOTE — Patient Instructions (Signed)

## 2017-08-07 ENCOUNTER — Encounter: Payer: Self-pay | Admitting: Physician Assistant

## 2017-08-08 ENCOUNTER — Encounter: Payer: Self-pay | Admitting: Physician Assistant

## 2017-08-08 DIAGNOSIS — L821 Other seborrheic keratosis: Secondary | ICD-10-CM | POA: Insufficient documentation

## 2017-08-08 DIAGNOSIS — E559 Vitamin D deficiency, unspecified: Secondary | ICD-10-CM | POA: Insufficient documentation

## 2017-08-08 DIAGNOSIS — R202 Paresthesia of skin: Secondary | ICD-10-CM | POA: Insufficient documentation

## 2017-08-09 LAB — CYTOLOGY - PAP
Diagnosis: NEGATIVE
HPV (WINDOPATH): NOT DETECTED

## 2017-08-09 NOTE — Progress Notes (Signed)
Call pt: HPV negative. No abnormal cells. Repeat pap in 3-5 years continue to come in for annual exam.

## 2017-09-04 ENCOUNTER — Emergency Department
Admission: EM | Admit: 2017-09-04 | Discharge: 2017-09-04 | Disposition: A | Discharge status: Home / Self Care | Payer: 59 | Source: Home / Self Care

## 2017-09-04 ENCOUNTER — Other Ambulatory Visit: Payer: Self-pay

## 2017-09-04 DIAGNOSIS — M79604 Pain in right leg: Secondary | ICD-10-CM | POA: Diagnosis not present

## 2017-09-04 MED ORDER — METHOCARBAMOL 500 MG PO TABS: ORAL_TABLET | ORAL | 0 refills | Status: DC

## 2017-09-04 MED ORDER — DICLOFENAC SODIUM 75 MG PO TBEC: 75.0000 mg | DELAYED_RELEASE_TABLET | Freq: Two times a day (BID) | ORAL | 0 refills | Status: DC

## 2017-09-04 MED FILL — METHOCARBAMOL 500 MG TABLET: 500 | 5 days supply | Qty: 20 | Fill #0

## 2017-09-04 MED FILL — DICLOFENAC SOD EC 75 MG TAB: 75 | 10 days supply | Qty: 20 | Fill #0

## 2017-09-04 NOTE — Discharge Instructions (Addendum)
Take diclofenac 75 mg 1 twice daily for pain and inflammation  Take methocarbamol (Robaxin) 500 mg 1 pill in the morning, 1 in the afternoon, and 2 at bedtime for muscle relaxant  Avoid straining your leg, but continue doing gentle activities and stretches.  You can use the diclofenac gel that you have twice a day if needed on the area also.  Additional Tylenol 500 mg maximum 2 pills 3 times daily can be taken for additional pain relief  If it is not subsiding with the muscle relaxant and anti-inflammatory medication over the next couple of days, then it would probably be worth getting into the sports medicine doctor at your family practice clinic.  Come in sooner at any time if abruptly worse.

## 2017-09-04 NOTE — ED Triage Notes (Signed)
Pt stated that she had a spot on her right knee where she felt fluid had accumulated Sunday night, then started having pain just above the knee.  Continued to have pain above the knee yesterday.  Pain has been from the hip to the knee since yesterday.

## 2017-09-04 NOTE — ED Provider Notes (Signed)
[facility]    CSN: [id number] Arrival date & time: 09/04/17  1141     History   Chief Complaint Chief Complaint  Patient presents with  . Leg Pain    right    HPI [name] is a 57 y.o. female.   HPI 3 or 4 days ago the patient developed pain in the lateral aspect of her right leg.  It is in the lateral thigh region.  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  It has intermittently hurt her.  At one point they felt like there is on not about for 5 inches above the right knee.  Her husband massage her leg some but did not really feel and not.  She awakened during the night last night with severe pain.  The pain is not radicular down to the foot.  It does not affect her in the back. Past Medical History:  Diagnosis Date  . Hypertension    not currently on meds    Patient Active Problem List   Diagnosis Date Noted  . Paresthesia 08/08/2017  . Vitamin D deficiency 08/08/2017  . Seborrheic keratosis 08/08/2017  . Left hip pain 06/12/2016  . Dyslipidemia 06/11/2016  . Cystocele 08/22/2015  . Absence of bladder continence 08/22/2015  . Hematuria 08/22/2015  . Vaginal dryness, menopausal 08/22/2015  . QT prolongation 01/28/2015  . Double vision 01/24/2015  . Hypertriglyceridemia 05/02/2014  . Overweight (BMI 25.0-29.9) 02/19/2014  . Depression 10/26/2013  . Stress at home 10/26/2013  . Essential hypertension, benign 10/26/2013  . Allergic rhinitis 10/26/2013    Past Surgical History:  Procedure Laterality Date  . INCONTINENCE SURGERY      OB History   None      Home Medications    Prior to Admission medications   Medication Sig Start Date End Date Taking? Authorizing Provider  AMBULATORY NON FORMULARY MEDICATION 2 dose shingrix series for shingles prevention. 08/06/17   Breeback, Royetta Car, PA-C  Black Pepper-Turmeric (TURMERIC COMPLEX/BLACK PEPPER) 3-500 MG CAPS  05/28/16    [provider]  CALCIUM-MAGNESUIUM-ZINC 333-133-8.3 MG TABS  07/26/16   [provider]  Coenzyme Q10 (CO Q-10) 400 MG CAPS  11/25/16   [provider]  diclofenac (VOLTAREN) 75 MG EC tablet Take 1 tablet (75 mg total) by mouth 2 (two) times daily. 09/04/17   Posey Boyer, MD  Icosapent Ethyl (VASCEPA) 1 g CAPS TAKE 2 CAPSULES BY MOUTH TWICE A DAY WITH MEALS. 08/06/17   Breeback, Jade L, PA-C  loratadine (CLARITIN) 10 MG tablet Take 10 mg by mouth daily.    [provider]  losartan-hydrochlorothiazide (HYZAAR) 50-12.5 MG tablet Take 1 tablet by mouth daily. 08/06/17   Donella Stade, PA-C  methocarbamol (ROBAXIN) 500 MG tablet Take 1 in the morning, 1 in the afternoon, and 2 at bedtime for muscle relaxant 09/04/17   Posey Boyer, MD  Red Yeast Rice 600 MG CAPS Take by mouth.    [provider]  triamcinolone (NASACORT) 55 MCG/ACT AERO nasal inhaler Place 2 sprays into the nose daily.    [provider]    Family History Family History  Problem Relation Age of Onset  . Hypertension Mother   . Colon cancer Mother        unsure if CA or pre-cancerous polyp  . Hypertension Sister   . Hyperlipidemia Brother   . Hypertension Brother  Social History Social History   Tobacco Use  . Smoking status: Never Smoker  . Smokeless tobacco: Never Used  Substance Use Topics  . Alcohol use: Yes    Comment: once yearly  . Drug use: No     Allergies   Contrave [naltrexone-bupropion hcl er] and Lisinopril   Review of Systems Review of Systems No other complaints other than the lateral right leg.  Physical Exam Triage Vital Signs ED Triage Vitals [09/04/17 1313]  Enc Vitals Group     BP (!) 173/98     Pulse Rate (!) 56     Resp      Temp 98.4 F (36.9 C)     Temp Source Oral     SpO2 100 %     Weight 159 lb (72.1 kg)     Height 5\' 2"  (1.575 m)     Head Circumference      Peak Flow      Pain Score 6     Pain Loc      Pain  Edu?      Excl. in [place]?    No data found.  Updated Vital Signs BP (!) 173/98 (BP Location: Right Arm)   Pulse (!) 56   Temp 98.4 F (36.9 C) (Oral)   Ht 5\' 2"  (1.575 m)   Wt 159 lb (72.1 kg)   LMP 11/25/2013   SpO2 100%   BMI 29.08 kg/m   Visual Acuity Right Eye Distance:   Left Eye Distance:   Bilateral Distance:    Right Eye Near:   Left Eye Near:    Bilateral Near:     Physical Exam Leg not swollen.  She said she had measured the circumference of the thigh as to make sure it was not swollen.  Her leg has no gross abnormalities visible.  Range of motion of the hip and knee are good.  On internal rotation of the right leg there is pain in the right lateral thigh, lateral quadriceps area.  There is tenderness about 5 or 6 inches above the right knee laterally.  She limps a little bit when she walks. UC Treatments / Results  Labs (all labs ordered are listed, but only abnormal results are displayed) Labs Reviewed - No data to display  EKG None Radiology No results found.  Procedures Procedures (including critical care time)  Medications Ordered in UC Medications - No data to display   Initial Impression / Assessment and Plan / UC Course  I have reviewed the triage vital signs and the nursing notes.  Pertinent labs & imaging results that were available during my care of the patient were reviewed by me and considered in my medical decision making (see chart for details).     Atypical right lateral leg pain, probably from a strain of something like the vastus lateralis muscle.  Will treat symptomatically.  Final Clinical Impressions(s) / UC Diagnoses   Final diagnoses:  Right leg pain    ED Discharge Orders        Ordered    methocarbamol (ROBAXIN) 500 MG tablet     09/04/17 1505    diclofenac (VOLTAREN) 75 MG EC tablet  2 times daily     09/04/17 1505    Take diclofenac 75 mg 1 twice daily for pain and inflammation  Take methocarbamol (Robaxin) 500 mg  1 pill in the morning, 1 in the afternoon, and 2 at bedtime for muscle relaxant  Avoid straining your leg, but continue  doing gentle activities and stretches.  You can use the diclofenac gel that you have twice a day if needed on the area also.  Additional Tylenol 500 mg maximum 2 pills 3 times daily can be taken for additional pain relief  If it is not subsiding with the muscle relaxant and anti-inflammatory medication over the next couple of days, then it would probably be worth getting into the sports medicine doctor at your family practice clinic.  Come in sooner at any time if abruptly worse.    Controlled Substance Prescriptions [place] Controlled Substance Registry consulted? No   Posey Boyer, MD 09/04/17 [phone]

## 2017-09-05 ENCOUNTER — Ambulatory Visit: Payer: 59 | Admitting: Physician Assistant

## 2017-09-13 DIAGNOSIS — J029 Acute pharyngitis, unspecified: Secondary | ICD-10-CM | POA: Diagnosis not present

## 2017-09-13 DIAGNOSIS — N390 Urinary tract infection, site not specified: Secondary | ICD-10-CM | POA: Diagnosis not present

## 2017-09-24 ENCOUNTER — Ambulatory Visit (INDEPENDENT_AMBULATORY_CARE_PROVIDER_SITE_OTHER): Payer: 59 | Admitting: Physician Assistant

## 2017-09-24 ENCOUNTER — Encounter: Payer: Self-pay | Admitting: Physician Assistant

## 2017-09-24 VITALS — BP 137/60 | HR 94 | Ht 62.0 in | Wt 157.0 lb

## 2017-09-24 DIAGNOSIS — M79604 Pain in right leg: Secondary | ICD-10-CM | POA: Insufficient documentation

## 2017-09-24 DIAGNOSIS — R252 Cramp and spasm: Secondary | ICD-10-CM

## 2017-09-24 DIAGNOSIS — M79651 Pain in right thigh: Secondary | ICD-10-CM | POA: Diagnosis not present

## 2017-09-24 DIAGNOSIS — Z1159 Encounter for screening for other viral diseases: Secondary | ICD-10-CM

## 2017-09-24 DIAGNOSIS — R152 Fecal urgency: Secondary | ICD-10-CM | POA: Diagnosis not present

## 2017-09-24 DIAGNOSIS — R159 Full incontinence of feces: Secondary | ICD-10-CM | POA: Diagnosis not present

## 2017-09-24 NOTE — Progress Notes (Deleted)
   Subjective:    Patient ID: Melanie Hart, female    DOB: November 08, 1960, 57 y.o.   MRN: [id number]  HPI 4/10 right lateral thigh pain  3 or 4 days ago the patient developed pain in the lateral aspect of her right leg.  It is in the lateral thigh region.  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  It has intermittently hurt her.  At one point they felt like there is on not about for 5 inches above the right knee.  Her husband massage her leg some but did not really feel and not.  She awakened during the night last night with severe pain.  The pain is not radicular down to the foot.  It does not affect her in the back.  Started any new medications.   caffiene PT Fecal in tolen. Diarrhea.   Review of Systems     Objective:   Physical Exam        Assessment & Plan:

## 2017-09-24 NOTE — Progress Notes (Signed)
Subjective:    Patient ID: LADEJA PELHAM, female    DOB: 21-Mar-1961, 57 y.o.   MRN: [id number]  HPI: 57 year old pleasant appearing female presents for evaluation of continued right lateral thigh pain. This has been going on for approximately one month and has been evaluated twice. She was seen on 09/04/17 by urgent care for this and diagnosed with a strain of the vastus lateralis muscle. She was prescribed Robaxin and Voltaren which she said provided minimal relief. She describes the leg pain as intermittent muscle spasms that last up to 30 minutes and then feel sore afterwards. The lateral side of her right knee has been slightly swollen towards the end of the day every day. She will notice the spasm coming on if she goes from a squatting position to a standing position. She does not recall any inciting event or injury before the onset of the pain. It does not feel better with movement or with rest. She has not noted any redness, swelling, bruising, or masses. She has not had any recent changes in her diet, only drinks caffeine in the morning, and says that she stays well hydrated throughout the day.   She had one episode of fecal incontinence several days after being seen at urgent care and being started on Robaxin. She described it as being very loose and a one-time event. No back pain , saddle anesthesia.   [place]itis and treated with a steroid injection, which subsequently alleviated her leg pain for several days.  .. Active Ambulatory Problems    Diagnosis Date Noted  . Depression 10/26/2013  . Stress at home 10/26/2013  . Essential hypertension, benign 10/26/2013  . Allergic rhinitis 10/26/2013  . Overweight (BMI 25.0-29.9) 02/19/2014  . Hypertriglyceridemia 05/02/2014  . Double vision 01/24/2015  . QT prolongation 01/28/2015  . Cystocele 08/22/2015  . Absence of bladder continence 08/22/2015  . Hematuria  08/22/2015  . Vaginal dryness, menopausal 08/22/2015  . Dyslipidemia 06/11/2016  . Left hip pain 06/12/2016  . Paresthesia 08/08/2017  . Vitamin D deficiency 08/08/2017  . Seborrheic keratosis 08/08/2017  . Right thigh pain 09/24/2017  . Muscle cramps 09/24/2017  . Incontinence of feces with fecal urgency 09/24/2017   Resolved Ambulatory Problems    Diagnosis Date Noted  . No Resolved Ambulatory Problems   Past Medical History:  Diagnosis Date  . Hypertension      Review of Systems  All other systems reviewed and are negative.        Objective:     Physical Exam  Constitutional: She is oriented to person, place, and time and well-developed, well-nourished, and in no distress.  HENT:  Head: Normocephalic and atraumatic.  Eyes: Pupils are equal, round, and reactive to light. Conjunctivae and EOM are normal.  Cardiovascular: Normal rate, regular rhythm and normal heart sounds.  Musculoskeletal:  Tenderness to deep palpation over the lateral aspect of the upper right thigh. No bruising or warmth or swelling. Negative faber test. No hip or back pain with supine straight leg raise. No pain with passive knee flexion and hip internal rotation. 5+ strength of lower extremities bilaterally. No tenderness to palpation of the right hip joint or right knee joint. No tenderness to palpation of the lumber spine, or iliac crests bilaterally.   Neurological: She is alert and oriented to person, place, and time.  Psychiatric: Affect and judgment normal.    Assessment &  Plan:  ..Diagnoses and all orders for this visit:  Right thigh pain -     DG FEMUR 1V RIGHT -     Ambulatory referral to Physical Therapy  Muscle cramps -     Magnesium -     COMPLETE METABOLIC PANEL WITH GFR -     CK -     Vitamin D 1,25 dihydroxy -     B12  Need for hepatitis C screening test -     Hepatitis C Antibody  Incontinence of feces with fecal urgency   Unclear etiology of pain at this time. It is  very consistent with muscle injury/strain of right vastus lateralis but we have no known event and been pretty persistent with pain for last month. Pt is not on any new medications.  Will get xray today and start PT. Continue to use NSAIDs, heat, stretches, biofreeze. Make sure staying hydrated. Will check labs to look for any electrolyte issues. If no improvement in next 2 weeks consider referral vs MRI.   Discussed one episode of fecal incontience likely muscle relaxer and one time event. Let us know if happening again. No other symptoms of back etiology. Strength of lower ext great.   [name].Spent 30 minutes with patient and greater than 50 percent of visit spent counseling patient regarding treatment plan.

## 2017-09-25 LAB — HEPATITIS C ANTIBODY
Hepatitis C Ab: NONREACTIVE
SIGNAL TO CUT-OFF: 0.01 (ref <1.00)

## 2017-09-25 LAB — COMPLETE METABOLIC PANEL WITH GFR
AG RATIO: 1.6 (calc) (ref 1.0–2.5)
ALBUMIN MSPROF: 4.6 g/dL (ref 3.6–5.1)
ALT: 18 U/L (ref 6–29)
AST: 16 U/L (ref 10–35)
Alkaline phosphatase (APISO): 99 U/L (ref 33–130)
BILIRUBIN TOTAL: 0.5 mg/dL (ref 0.2–1.2)
BUN: 13 mg/dL (ref 7–25)
CALCIUM: 9.8 mg/dL (ref 8.6–10.4)
CHLORIDE: 100 mmol/L (ref 98–110)
CO2: 29 mmol/L (ref 20–32)
Creat: 0.66 mg/dL (ref 0.50–1.05)
GFR, EST NON AFRICAN AMERICAN: 100 mL/min/{1.73_m2} (ref >=60)
GFR, Est African American: 116 mL/min/{1.73_m2} (ref >=60)
GLUCOSE: 100 mg/dL — AB (ref 65–99)
Globulin: 2.8 g/dL (calc) (ref 1.9–3.7)
POTASSIUM: 3.4 mmol/L — AB (ref 3.5–5.3)
Sodium: 140 mmol/L (ref 135–146)
TOTAL PROTEIN: 7.4 g/dL (ref 6.1–8.1)

## 2017-09-25 LAB — MAGNESIUM: Magnesium: 2.2 mg/dL (ref 1.5–2.5)

## 2017-09-25 LAB — CK: Total CK: 99 U/L (ref 29–143)

## 2017-09-25 LAB — VITAMIN B12: Vitamin B-12: 571 pg/mL (ref 200–1100)

## 2017-09-26 ENCOUNTER — Encounter: Payer: Self-pay | Admitting: Physician Assistant

## 2017-09-26 ENCOUNTER — Ambulatory Visit (INDEPENDENT_AMBULATORY_CARE_PROVIDER_SITE_OTHER): Payer: 59

## 2017-09-26 DIAGNOSIS — M79651 Pain in right thigh: Secondary | ICD-10-CM | POA: Diagnosis not present

## 2017-09-26 IMAGING — DX DG FEMUR 2+V*R*
4 series · 4 of 4 positions shown · non-contrast
Comparison: None.

CLINICAL DATA: Pain in the outer right thigh for 6 weeks, no injury

EXAM:
RIGHT FEMUR 2 VIEWS

[femur ap (1 of 2)]
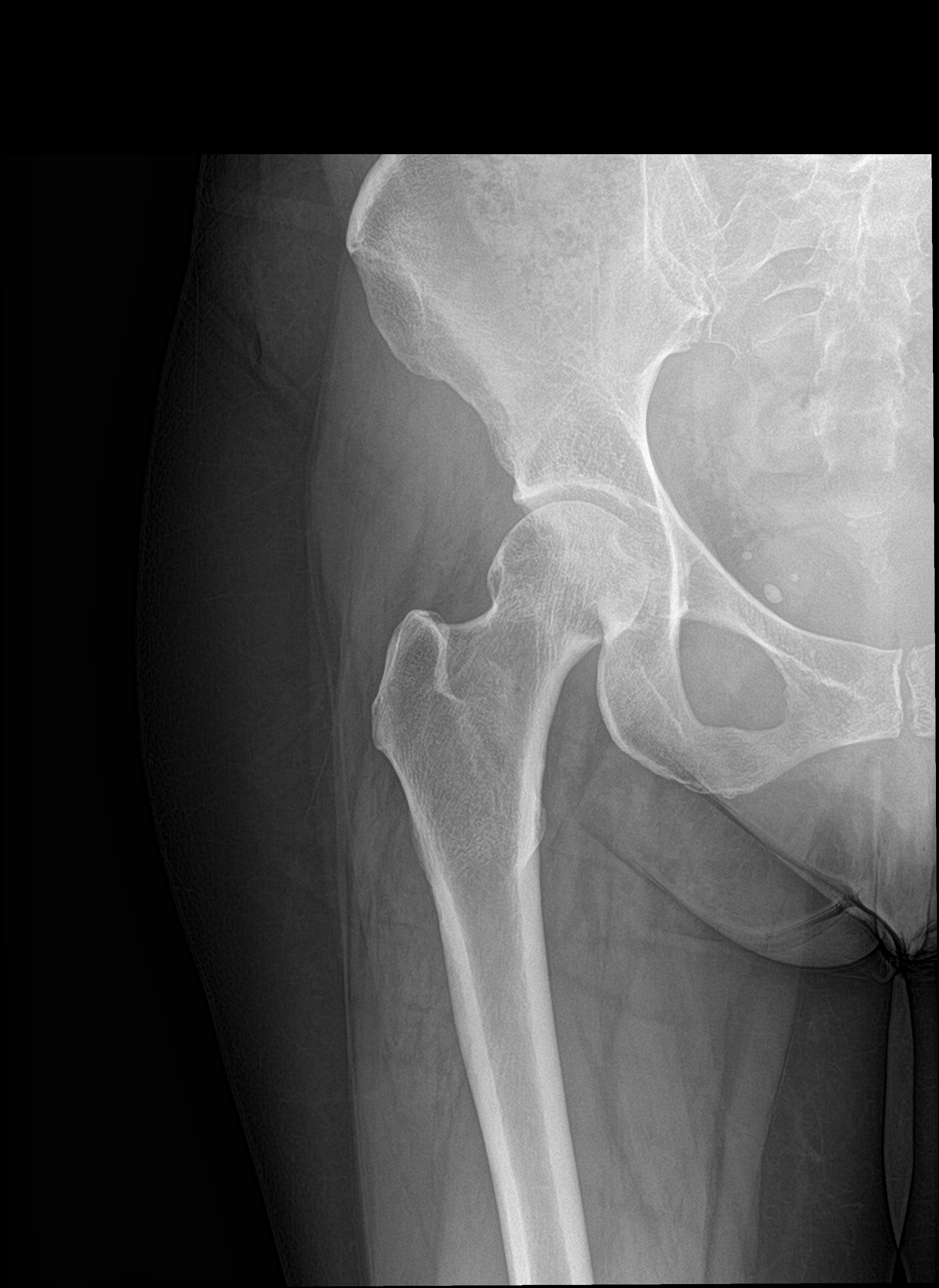

[femur ap (2 of 2)]
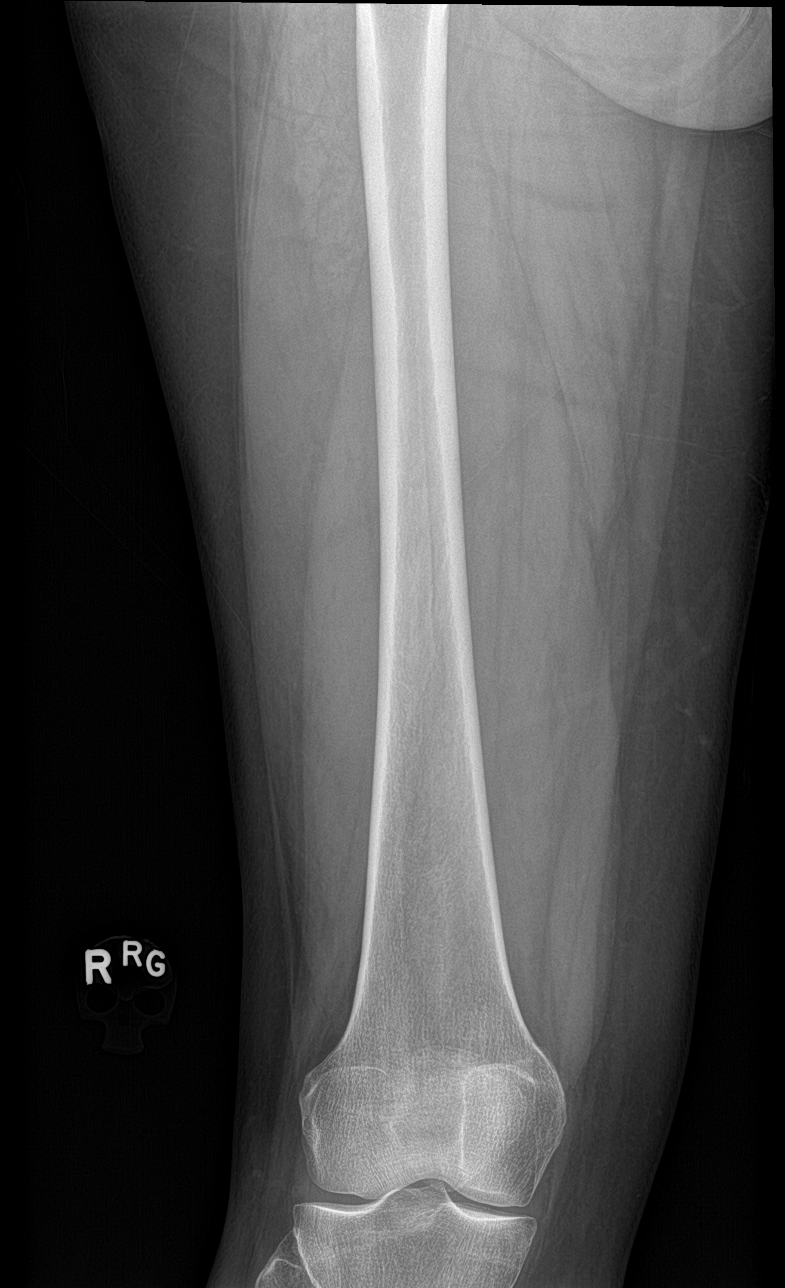

[femur lat (1 of 2)]
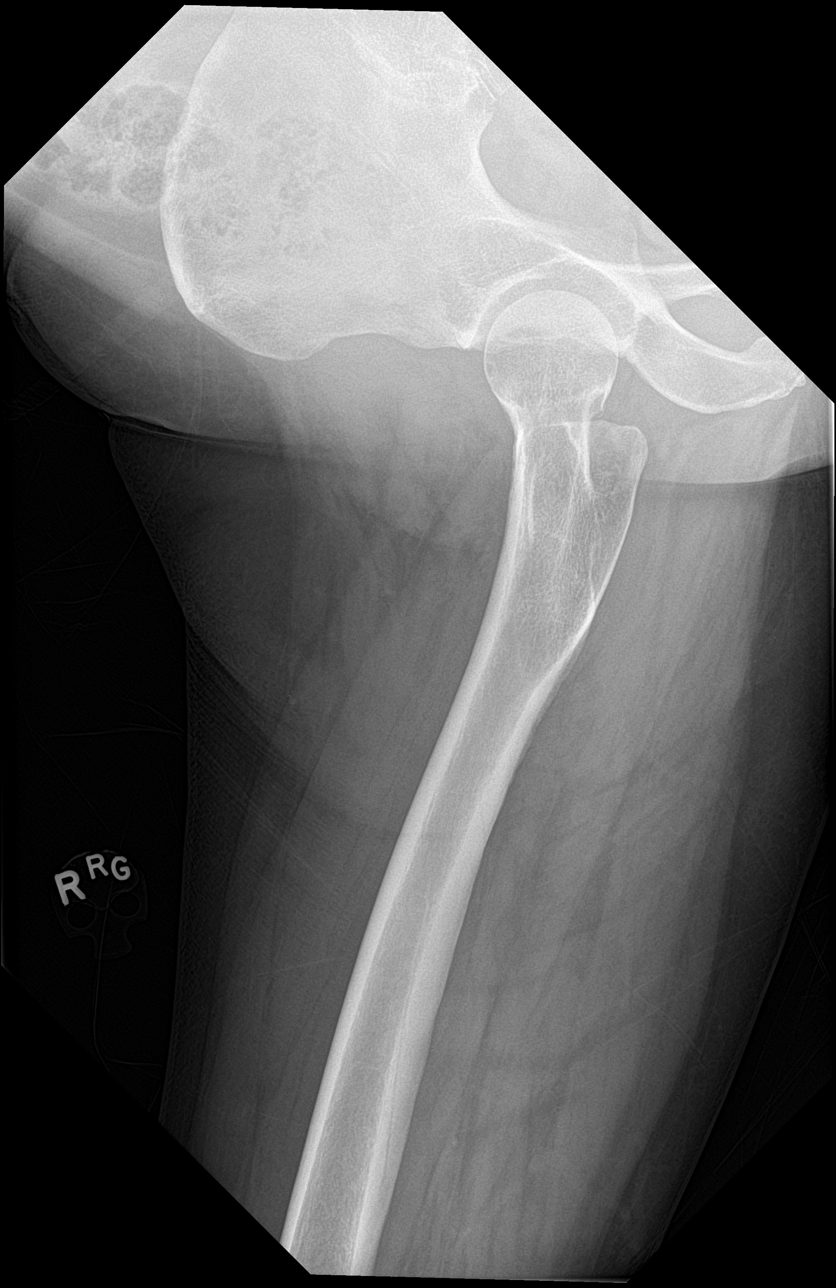

[femur lat (2 of 2)]
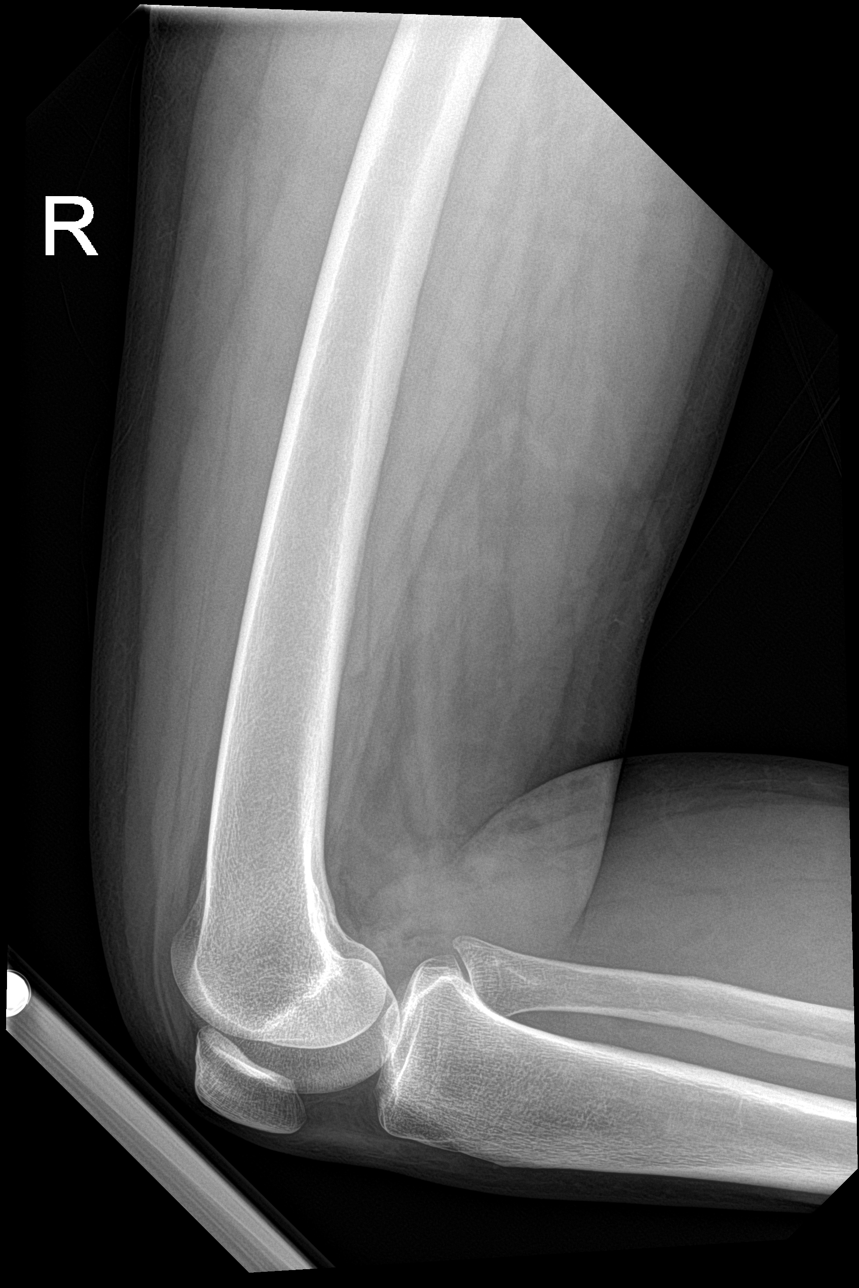

[4 of 4 positions shown; findings below may reference images not displayed]

FINDINGS: The right femur is normally positioned and no acute abnormality is
seen. The right ramus is intact and the right SI joint appears
corticated. No abnormality of the soft tissues of the right thigh is
seen.
IMPRESSION: Negative.

## 2017-09-26 NOTE — Progress Notes (Signed)
Call pt: hep C negative. b12 great.  Magnesium normal range.  CK normal.  Potassium just a hair low but likely not to cause symptoms. Do increase potassium rich foods.   I spoke with sports medicine doctor about your symptoms I do think we should xray lumbar spine? Did you get xray of leg?

## 2017-09-26 NOTE — Progress Notes (Signed)
No acute findings on xray.

## 2017-09-27 ENCOUNTER — Ambulatory Visit (INDEPENDENT_AMBULATORY_CARE_PROVIDER_SITE_OTHER): Payer: 59

## 2017-09-27 DIAGNOSIS — M79652 Pain in left thigh: Secondary | ICD-10-CM

## 2017-09-27 LAB — VITAMIN D 1,25 DIHYDROXY
Vitamin D 1, 25 (OH)2 Total: 42 pg/mL (ref 18–72)
Vitamin D2 1, 25 (OH)2: <8 pg/mL
Vitamin D3 1, 25 (OH)2: 42 pg/mL

## 2017-09-27 IMAGING — DX DG LUMBAR SPINE COMPLETE 4+V
5 series · 5 of 5 positions shown · non-contrast
Comparison: None.

CLINICAL DATA: Left thigh pain for 6 weeks.

EXAM:
LUMBAR SPINE - COMPLETE 4+ VIEW

[l-spine ap]
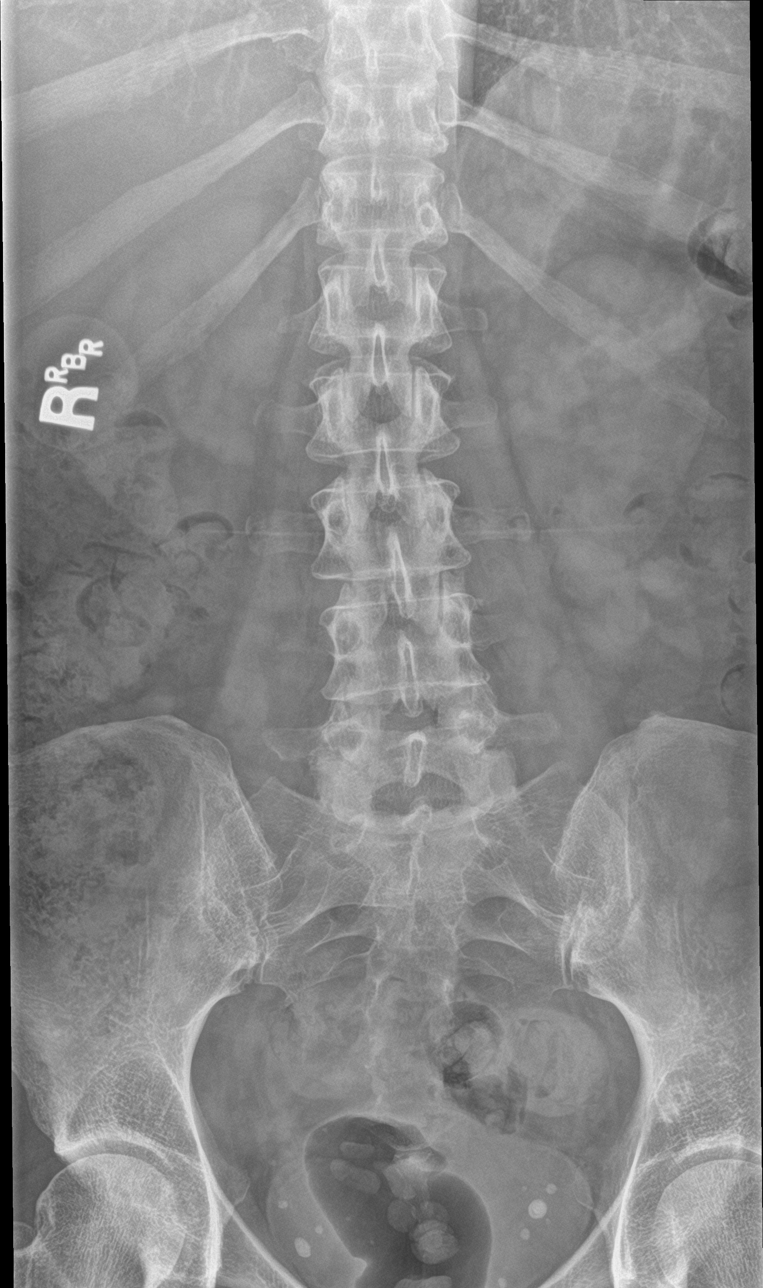

[l-spine obl (1 of 2)]
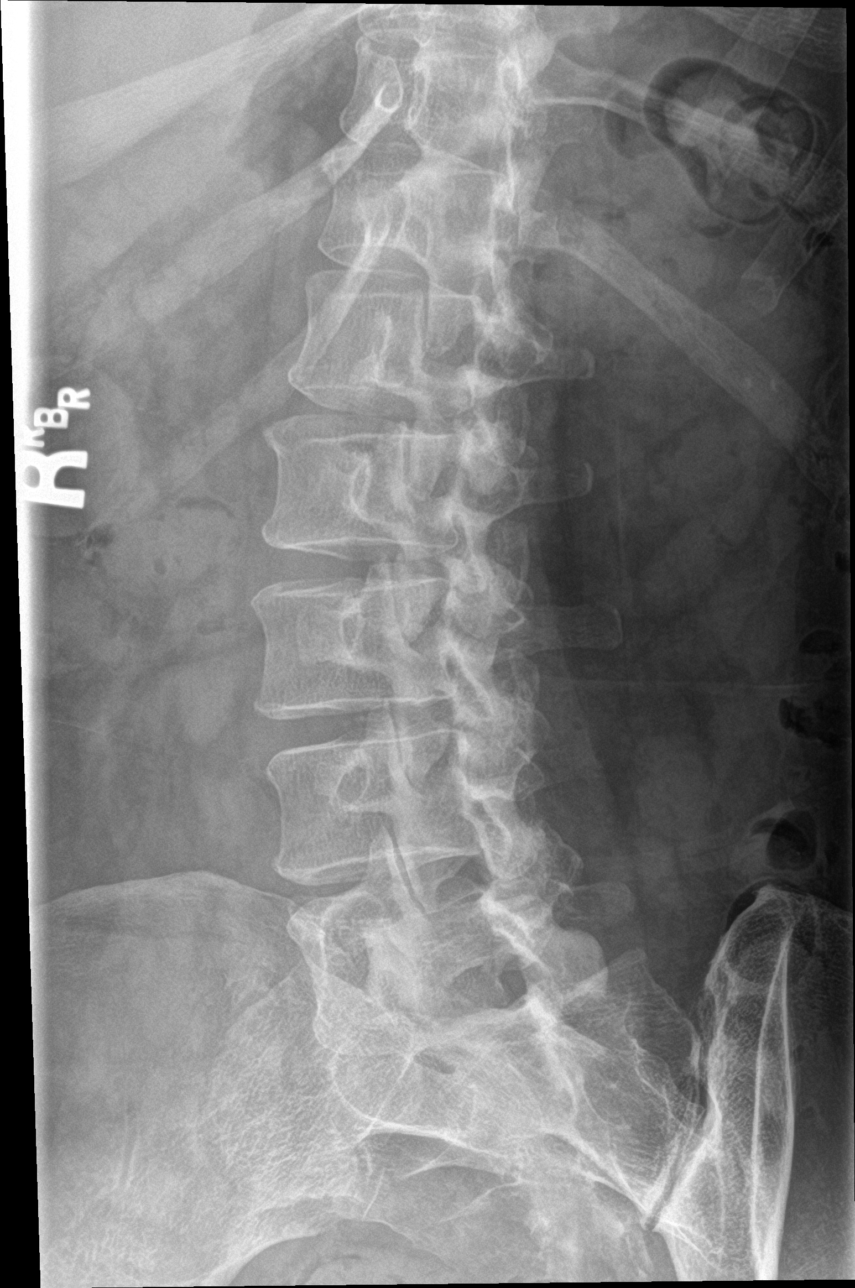

[l-spine obl (2 of 2)]
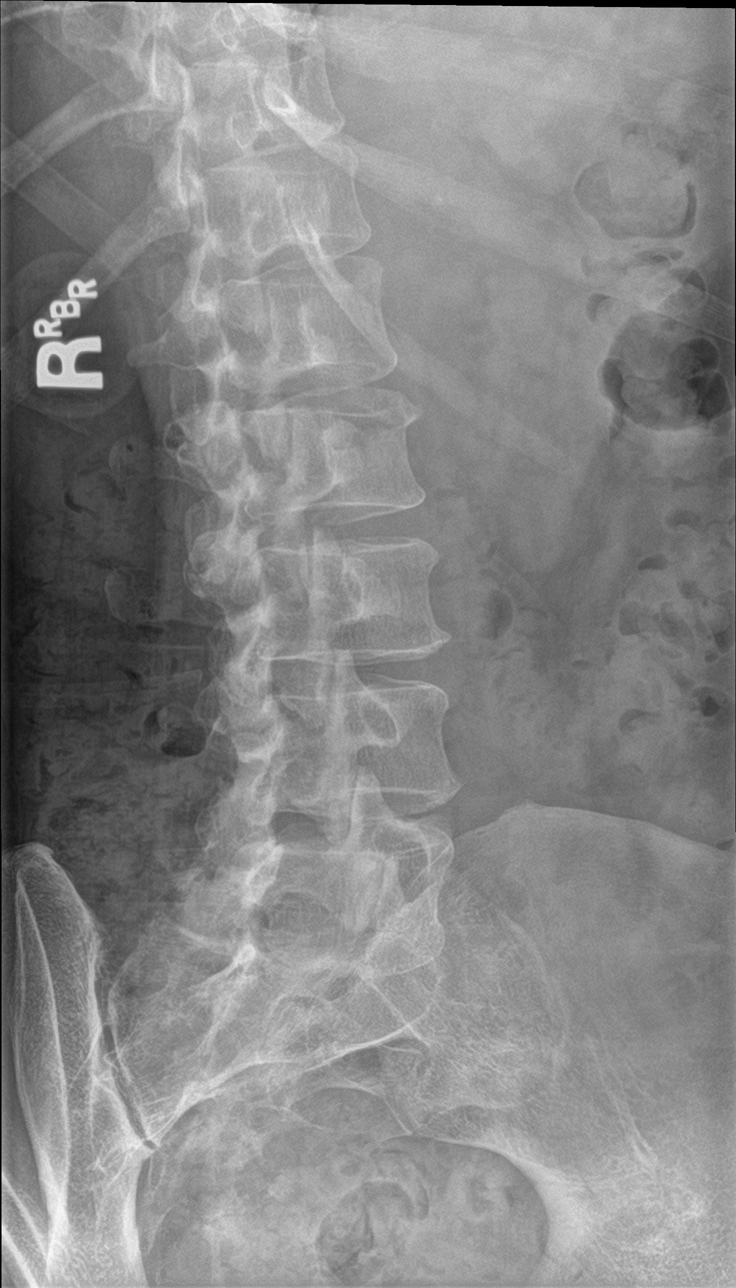

[l-spine lat]
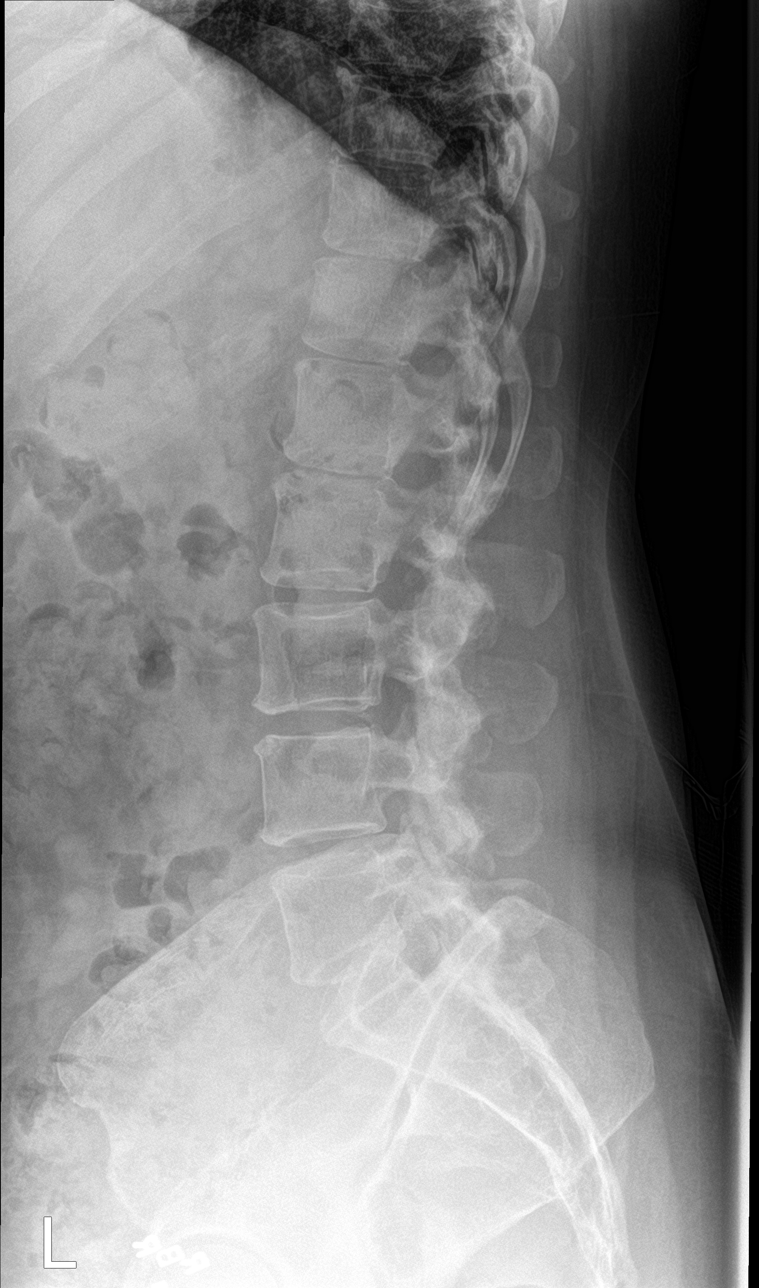

[l-spine spot]
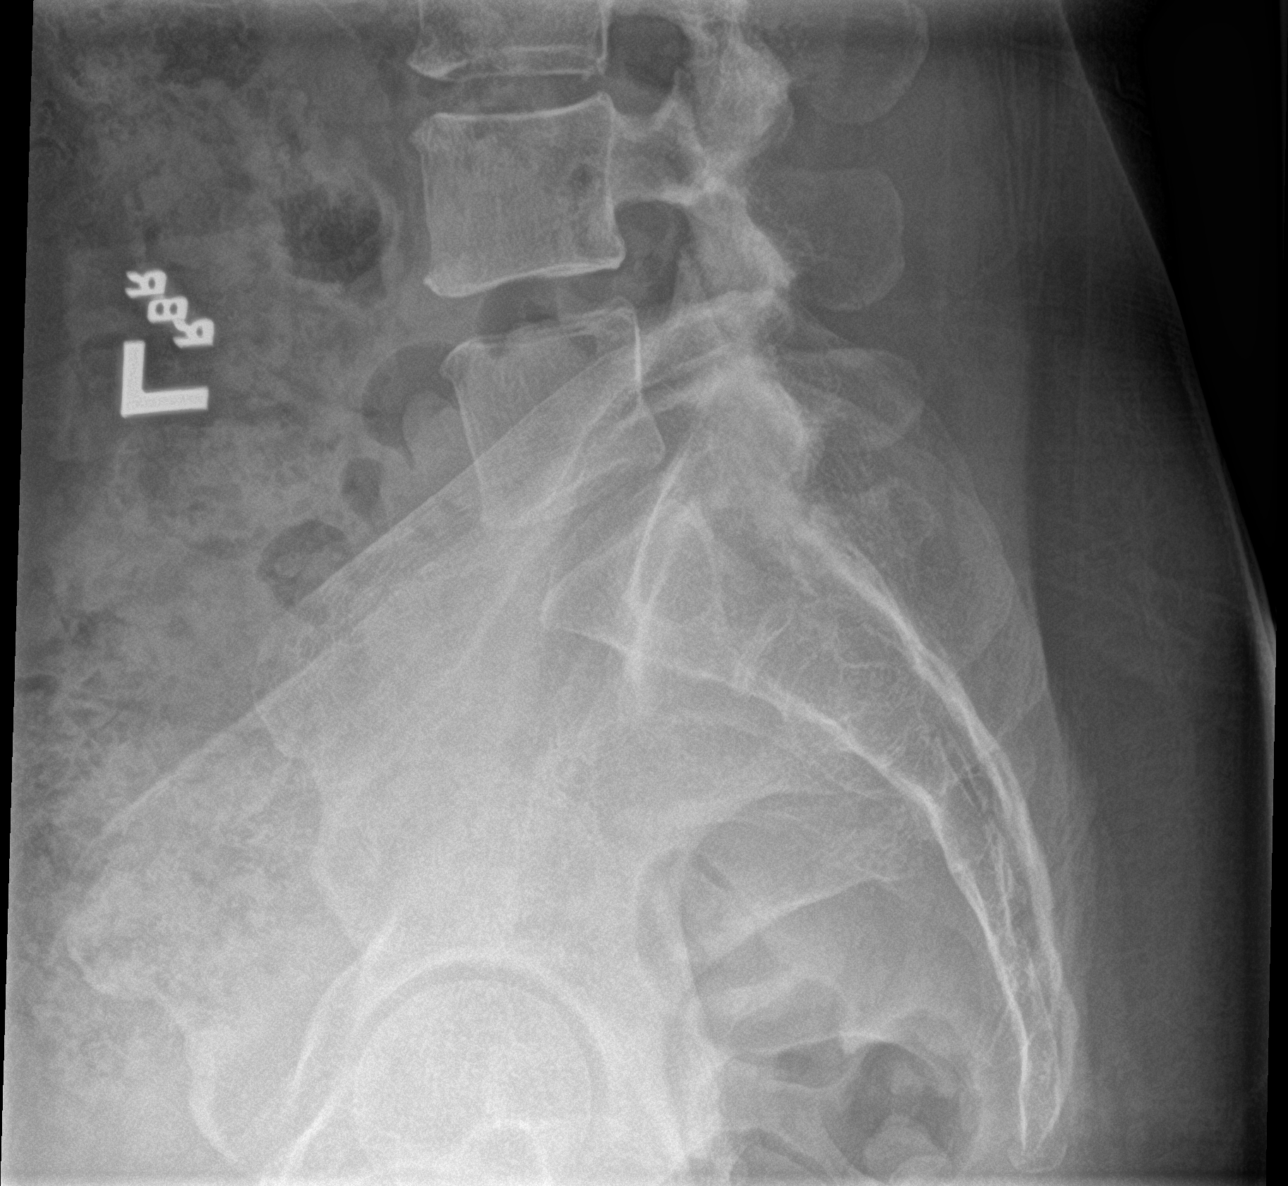

[5 of 5 positions shown; findings below may reference images not displayed]

FINDINGS: There is no evidence of lumbar spine fracture. Alignment is normal.
Intervertebral disc spaces are maintained.
IMPRESSION: Normal lumbar spine.

## 2017-09-27 NOTE — Telephone Encounter (Signed)
Call pt: lumbar spine xray looks great!

## 2017-09-27 NOTE — Progress Notes (Signed)
Call pt: vitamin D looks good.

## 2017-10-03 ENCOUNTER — Encounter: Payer: Self-pay | Admitting: Physical Therapy

## 2017-10-03 ENCOUNTER — Ambulatory Visit: Payer: 59 | Admitting: Physical Therapy

## 2017-10-03 DIAGNOSIS — M62838 Other muscle spasm: Secondary | ICD-10-CM

## 2017-10-03 DIAGNOSIS — M79604 Pain in right leg: Secondary | ICD-10-CM

## 2017-10-03 DIAGNOSIS — M6281 Muscle weakness (generalized): Secondary | ICD-10-CM | POA: Diagnosis not present

## 2017-10-03 NOTE — Therapy (Signed)
[place], [place], [address] Phone: [phone]   Fax:  [phone]  Physical Therapy Evaluation  Patient Details  Name: Melanie Hart MRN: [id number] Date of Birth: Dec 02, 1960 Referring Provider: Iran Planas PA   Encounter Date: 10/03/2017  PT End of Session - 10/03/17 1444    Visit Number  1    Number of Visits  8    Date for PT Re-Evaluation  10/31/17    PT Start Time  1324    PT Stop Time  1537    PT Time Calculation (min)  53 min    Activity Tolerance  Patient tolerated treatment well       Past Medical History:  Diagnosis Date  . Hypertension    not currently on meds    Past Surgical History:  Procedure Laterality Date  . INCONTINENCE SURGERY      There were no vitals filed for this visit.   Subjective Assessment - 10/03/17 1448    Subjective  Pt reports she has been having Rt lateral leg pain and cramping. The cramping is occurring a couple times a day, lasting 10-20 min, feels like a charlie horse. Typically no pattern to when the cramping happens.     Diagnostic tests  x-rays of back and leg (-) , all blood work is good.     Patient Stated Goals  rehab the muscle if it is damaged and make it stop cramping.     Currently in Pain?  Yes    Pain Score  2     Pain Location  Leg    Pain Orientation  Right;Lateral    Pain Descriptors / Indicators  Dull    Pain Type  Acute pain    Pain Onset  More than a month ago    Pain Frequency  Intermittent    Aggravating Factors   random    Pain Relieving Factors  time          [facility] PT Assessment - 10/03/17 0001      Assessment   Medical Diagnosis  Rt thigh pain    Referring Provider  Iran Planas PA    Onset Date/Surgical Date  08/22/17    Hand Dominance  Right    Next MD Visit  after PT    Prior Therapy  none      Precautions   Precautions  None    Precaution Comments  carpal tunnel cock up braces      Balance Screen   Has the patient fallen in the past 6 months  Yes    How many times?  1 over a tree root, happened so quick not sure how she fell,    Has the patient had a decrease in activity level because of a fear of falling?   No    Is the patient reluctant to leave their home because of a fear of falling?   No      Home Environment   Living Environment  Private residence    Living Arrangements  Spouse/significant other    Home Layout  Two level no trouble      Prior Function   Level of Independence  Independent    Vocation  Full time employment    Vocation Requirements  neuro dx tech    Leisure  work on property, tend animals.       Observation/Other Assessments   Focus on Therapeutic Outcomes (FOTO)   40% limited  Functional Tests   Functional tests  Squat;Single leg stance      Squat   Comments  slight knee adduction      Single Leg Stance   Comments  WNL increased accesory motion at Rt ankle as compared to Lt       Posture/Postural Control   Posture/Postural Control  -- pronation Rt foot      ROM / Strength   AROM / PROM / Strength  AROM;Strength      AROM   AROM Assessment Site  Lumbar;Hip    Lumbar Flexion  2" from the floor with lateral thigh pain    Lumbar Extension  WNL    Lumbar - Right Side Bend  WNL    Lumbar - Left Side Bend  WNL    Lumbar - Right Rotation  WNL with pulling in Rt thigh    Lumbar - Left Rotation  WNL      Strength   Strength Assessment Site  Hip;Knee;Ankle    Right/Left Hip  Right    Right Hip Flexion  5/5    Right Hip ABduction  -- 5-/5    Right/Left Knee  Right Lt WNl    Right Knee Flexion  4+/5    Right Knee Extension  5/5    Right/Left Ankle  -- WNL      Flexibility   Soft Tissue Assessment /Muscle Length  yes tight Rt TFL    Hamstrings  WNL    Quadriceps  prone knee flex Rt 114, Lt 141    Piriformis  slight tightness      Palpation   Spinal mobility  sacrum and lumbar spine WNL    Palpation comment  tenderness in Rt vastis  lateralis, banding palpable proximally , tender and band in Rt biceps femoris , slight tightness in Rt gluts/piriformis                 Objective measurements completed on examination: See above findings.      [facility] Adult PT Treatment/Exercise - 10/03/17 0001      Exercises   Exercises  Knee/Hip      Knee/Hip Exercises: Stretches   Passive Hamstring Stretch  Right;1 rep;60 seconds supine with strap    Quad Stretch  Right;1 rep;60 seconds prone with strap    ITB Stretch  Right;60 seconds cross body with strap    Other Knee/Hip Stretches  foam roller for quads, HS and ITB             PT Education - 10/03/17 1535    Education provided  Yes    Education Details  HEP    Person(s) Educated  Patient    Methods  Explanation;Demonstration;Handout    Comprehension  Returned demonstration;Verbalized understanding          PT Long Term Goals - 10/03/17 1446      PT LONG TERM GOAL #1   Title  I with advanced HEP ( 10/31/17)     Time  4    Period  Weeks    Status  New      PT LONG TERM GOAL #2   Title  improve FOTO =/< 28% limited ( 10/31/17)     Time  4    Period  Weeks    Status  New      PT LONG TERM GOAL #3   Title  report =/> 75% reduction in rt leg pain/spasms with daily activity ( 10/31/17)      Time  4    Period  Weeks    Status  New      PT LONG TERM GOAL #4   Title  improve Rt quad flexibility to within 5 degrees of Lt ( 10/31/17)     Time  4    Period  Weeks    Status  New      PT LONG TERM GOAL #5   Title  improve Rt hip and knee strenth =/> 5-/5 (10/31/17)     Time  4    Period  Weeks    Status  New             Plan - 10/03/17 1730    Clinical Impression Statement  57 yo female with insideous onset of Rt lateral thigh pain about 6 wks ago.  X-rays for leg and back were (-).  She has some weakness in the Rt hip/knee, pain with palpation in the Rt vastus lateralis and biceps femoris with palpable trigger points and banding.  Tightness in her  hamstrings, TFL and quad Rt.     Clinical Presentation  Stable    Clinical Decision Making  Low    Rehab Potential  Excellent    PT Frequency  2x / week    PT Duration  4 weeks    PT Treatment/Interventions  Iontophoresis 4mg /ml Dexamethasone;Dry needling;Manual techniques;Moist Heat;Cryotherapy;Lobbyist;Therapeutic activities;Therapeutic exercise;Ultrasound;Patient/family education    PT Next Visit Plan  possible DN to Rt quad/HS manual work, eccentric quad and HS work.     Consulted and Agree with Plan of Care  Patient       Patient will benefit from skilled therapeutic intervention in order to improve the following deficits and impairments:  Pain, Increased muscle spasms, Impaired flexibility, Decreased strength  Visit Diagnosis: Pain in right leg - Plan: PT plan of care cert/re-cert  Other muscle spasm - Plan: PT plan of care cert/re-cert  Muscle weakness (generalized) - Plan: PT plan of care cert/re-cert     Problem List Patient Active Problem List   Diagnosis Date Noted  . Right thigh pain 09/24/2017  . Muscle cramps 09/24/2017  . Incontinence of feces with fecal urgency 09/24/2017  . Paresthesia 08/08/2017  . Vitamin D deficiency 08/08/2017  . Seborrheic keratosis 08/08/2017  . Left hip pain 06/12/2016  . Dyslipidemia 06/11/2016  . Cystocele 08/22/2015  . Absence of bladder continence 08/22/2015  . Hematuria 08/22/2015  . Vaginal dryness, menopausal 08/22/2015  . QT prolongation 01/28/2015  . Double vision 01/24/2015  . Hypertriglyceridemia 05/02/2014  . Overweight (BMI 25.0-29.9) 02/19/2014  . Depression 10/26/2013  . Stress at home 10/26/2013  . Essential hypertension, benign 10/26/2013  . Allergic rhinitis 10/26/2013    Jeral Pinch PT 10/03/2017, 5:43 PM  [facility] [place], [place], [address] Phone: [phone]   Fax:  [phone]  Name: Melanie Hart MRN: [id number] Date of Birth: 01-Jun-1960

## 2017-10-10 ENCOUNTER — Encounter: Payer: Self-pay | Admitting: Physical Therapy

## 2017-10-10 ENCOUNTER — Ambulatory Visit: Payer: 59 | Admitting: Physical Therapy

## 2017-10-10 DIAGNOSIS — M62838 Other muscle spasm: Secondary | ICD-10-CM | POA: Diagnosis not present

## 2017-10-10 DIAGNOSIS — M6281 Muscle weakness (generalized): Secondary | ICD-10-CM

## 2017-10-10 DIAGNOSIS — M79604 Pain in right leg: Secondary | ICD-10-CM | POA: Diagnosis not present

## 2017-10-10 NOTE — Therapy (Signed)
[place], [place], [address] Phone: [phone]   Fax:  [phone]  Physical Therapy Treatment  Patient Details  Name: Melanie Hart MRN: [id number] Date of Birth: 02-May-1961 Referring Provider: Iran Planas PA   Encounter Date: 10/10/2017  PT End of Session - 10/10/17 1524    Visit Number  2    Number of Visits  8    Date for PT Re-Evaluation  10/31/17    PT Start Time  8786    PT Stop Time  1629    PT Time Calculation (min)  65 min       Past Medical History:  Diagnosis Date  . Hypertension    not currently on meds    Past Surgical History:  Procedure Laterality Date  . INCONTINENCE SURGERY      There were no vitals filed for this visit.  Subjective Assessment - 10/10/17 1524    Subjective  Pt reports she has only had two bad cramping days since her last visit.  Both of those days she had to stand in line for a long period of time.  yesterday was the second time it has happened. pt is numb in the lateral distal knee/upper calf    Patient Stated Goals  rehab the muscle if it is damaged and make it stop cramping.     Currently in Pain?  Yes    Pain Score  5     Pain Location  -- thigh    Pain Orientation  Right;Lateral    Pain Descriptors / Indicators  Cramping;Tightness;Sore    Pain Type  Acute pain    Pain Onset  More than a month ago    Pain Frequency  Intermittent    Aggravating Factors   standing still    Pain Relieving Factors  time         [facility] PT Assessment - 10/10/17 0001      Assessment   Medical Diagnosis  Rt thigh pain      Strength   Right Hip ABduction  -- 5-/5                   [facility] Adult PT Treatment/Exercise - 10/10/17 0001      Knee/Hip Exercises: Stretches   Quad Stretch  Right;60 seconds with lateral pull    ITB Stretch  Right;30 seconds    Other Knee/Hip Stretches  foam roller for quads, HS and ITB    Other Knee/Hip Stretches  S/l  Rt LE off bed      Knee/Hip Exercises: Aerobic   Stationary Bike  L2x5'      Knee/Hip Exercises: Standing   Other Standing Knee Exercises  single leg sit to/from stand Rt 2x10 from 2' height      Modalities   Modalities  Electrical Stimulation;Moist Heat      Moist Heat Therapy   Number Minutes Moist Heat  15 Minutes    Moist Heat Location  -- Rt thigh and hip      Electrical Stimulation   Electrical Stimulation Location  Lateral Rt thigh    Electrical Stimulation Action  IFC     Electrical Stimulation Parameters  to tolerance    Electrical Stimulation Goals  Pain;Tone      Manual Therapy   Manual Therapy  Soft tissue mobilization    Soft tissue mobilization  STM to Rt quad and TFL with TPR        Trigger  Point Dry Needling - 10/10/17 1528    Consent Given?  Yes    Education Handout Provided  Yes    Muscles Treated Lower Body  Quadriceps;Tensor fascia lata Rt     Tensor Fascia Lata Response  Palpable increased muscle length;Twitch response elicited    Quadriceps Response  Twitch response elicited;Palpable increased muscle length vastus lateralis with stim                PT Long Term Goals - 10/10/17 1615      PT LONG TERM GOAL #1   Title  I with advanced HEP ( 10/31/17)     Status  On-going      PT LONG TERM GOAL #2   Title  improve FOTO =/< 28% limited ( 10/31/17)     Status  On-going      PT LONG TERM GOAL #3   Title  report =/> 75% reduction in rt leg pain/spasms with daily activity ( 10/31/17)      Status  On-going 50% improvement      PT LONG TERM GOAL #4   Title  improve Rt quad flexibility to within 5 degrees of Lt ( 10/31/17)     Status  On-going      PT LONG TERM GOAL #5   Title  improve Rt hip and knee strenth =/> 5-/5 (10/31/17)     Status  Partially Met            Plan - 10/10/17 1616    Clinical Impression Statement  This is Melanie Hart's second visit and she is making good progress with about 50% reduction in LE spasms.  She has trigger points  in the Rt vastus lateralis, decreased after treatment.  She fatigued with ther ex. Would benift from continued treatement to address goals.     Rehab Potential  Excellent    PT Frequency  2x / week    PT Duration  4 weeks    PT Treatment/Interventions  Iontophoresis '4mg'$ /ml Dexamethasone;Dry needling;Manual techniques;Moist Heat;Cryotherapy;Lobbyist;Therapeutic activities;Therapeutic exercise;Ultrasound;Patient/family education    PT Next Visit Plan  assess response to DN cont withmanual work and eccentric quad loading.     Consulted and Agree with Plan of Care  Patient       Patient will benefit from skilled therapeutic intervention in order to improve the following deficits and impairments:  Pain, Increased muscle spasms, Impaired flexibility, Decreased strength  Visit Diagnosis: Pain in right leg  Other muscle spasm  Muscle weakness (generalized)     Problem List Patient Active Problem List   Diagnosis Date Noted  . Right thigh pain 09/24/2017  . Muscle cramps 09/24/2017  . Incontinence of feces with fecal urgency 09/24/2017  . Paresthesia 08/08/2017  . Vitamin D deficiency 08/08/2017  . Seborrheic keratosis 08/08/2017  . Left hip pain 06/12/2016  . Dyslipidemia 06/11/2016  . Cystocele 08/22/2015  . Absence of bladder continence 08/22/2015  . Hematuria 08/22/2015  . Vaginal dryness, menopausal 08/22/2015  . QT prolongation 01/28/2015  . Double vision 01/24/2015  . Hypertriglyceridemia 05/02/2014  . Overweight (BMI 25.0-29.9) 02/19/2014  . Depression 10/26/2013  . Stress at home 10/26/2013  . Essential hypertension, benign 10/26/2013  . Allergic rhinitis 10/26/2013    Jeral Pinch PT  10/10/2017, 4:19 PM  [facility] [place], [place], [address] Phone: [phone]   Fax:  [phone]  Name: Melanie Hart MRN: [id number] Date of Birth: January 16, 1961

## 2017-10-10 NOTE — Patient Instructions (Addendum)

## 2017-10-15 ENCOUNTER — Ambulatory Visit (INDEPENDENT_AMBULATORY_CARE_PROVIDER_SITE_OTHER): Payer: 59 | Admitting: Physical Therapy

## 2017-10-15 ENCOUNTER — Encounter: Payer: Self-pay | Admitting: Physical Therapy

## 2017-10-15 DIAGNOSIS — M62838 Other muscle spasm: Secondary | ICD-10-CM

## 2017-10-15 DIAGNOSIS — M79604 Pain in right leg: Secondary | ICD-10-CM | POA: Diagnosis not present

## 2017-10-15 DIAGNOSIS — M6281 Muscle weakness (generalized): Secondary | ICD-10-CM

## 2017-10-15 NOTE — Therapy (Signed)
[place], [place], [address] Phone: [phone]   Fax:  [phone]  Physical Therapy Treatment  Patient Details  Name: Melanie Hart MRN: [id number] Date of Birth: Oct 24, 1960 Referring Provider: Iran Planas, PA   Encounter Date: 10/15/2017  PT End of Session - 10/15/17 0809    Visit Number  3    Number of Visits  8    Date for PT Re-Evaluation  10/31/17    PT Start Time  0806 pt arrived late    PT Stop Time  0906    PT Time Calculation (min)  60 min    Activity Tolerance  Patient tolerated treatment well    Behavior During Therapy  [facility] for tasks assessed/performed       Past Medical History:  Diagnosis Date  . Hypertension    not currently on meds    Past Surgical History:  Procedure Laterality Date  . INCONTINENCE SURGERY      There were no vitals filed for this visit.  Subjective Assessment - 10/15/17 0809    Subjective  Pt reports she was very sore in her Rt thigh for a full day following last treatment with DN.  "I don't think my knots are as big".    Her cramping is still as frequent but not as intense.   She is disappointed her strength hasn't improved as much as she would like.     Patient Stated Goals  rehab the muscle if it is damaged and make it stop cramping.     Currently in Pain?  Yes    Pain Score  3     Pain Location  Leg Rt thigh    Pain Orientation  Right;Lateral;Upper;Lower    Pain Descriptors / Indicators  Tightness;Sore    Aggravating Factors   standing still; after prolonged sitting; after stopping walking    Pain Relieving Factors  time.          [facility] PT Assessment - 10/15/17 0001      Assessment   Medical Diagnosis  Rt thigh pain    Referring Provider  Iran Planas, PA    Onset Date/Surgical Date  08/22/17    Hand Dominance  Right    Next MD Visit  after PT      ROM / Strength   AROM / PROM / Strength  AROM      AROM   AROM Assessment Site   Knee    Right/Left Knee  Right    Right Knee Flexion  140      Strength   Right Knee Flexion  4+/5      Flexibility   Quadriceps  Rt 130 deg.         [facility] PT Treatment/Exercise - 10/15/17 0001      Knee/Hip Exercises: Stretches   Passive Hamstring Stretch  Right;2 reps;60 seconds    Quad Stretch  Right;30 seconds;Limitations;5 reps 2 reps standing, 3 prone    Quad Stretch Limitations  towel under knee in prone.     ITB Stretch  Right;3 reps;60 seconds standing; 2 supine    Piriformis Stretch  Right;Left;1 rep;30 seconds      Knee/Hip Exercises: Aerobic   Stationary Bike  L2x5'      Knee/Hip Exercises: Standing   Other Standing Knee Exercises  single leg sit to stand x 5 reps, 3 sets (elevated table, light toe touch with LLE)- 1 set of 10 on LLE  Knee/Hip Exercises: Supine   Single Leg Bridge  Strengthening;Left;Right;1 set;10 reps 2 sets on RLE, fig 4 position      Modalities   Modalities  Electrical Stimulation;Moist Heat      Moist Heat Therapy   Number Minutes Moist Heat  15 Minutes    Moist Heat Location  -- Rt thigh and hip      Electrical Stimulation   Electrical Stimulation Location  Lateral Rt thigh and hip    Electrical Stimulation Action  IFC    Electrical Stimulation Parameters  to tolerance    Electrical Stimulation Goals  Pain;Tone      Manual Therapy   Manual Therapy  Taping;Soft tissue mobilization    Manual therapy comments  I strip of Rock tape placed along Rt lateral thigh (knee to hip) with 10% stretch, to decmpress tissue, decrease pain, increase proprioception.  small I strip placed perpendicular at distal ITB with 75% stretch     Soft tissue mobilization  Edge tool assistance to Rt distal to prox quad, lateral Rt hip to decrease fascial restrictions and improved Rt knee/hip mobility.              PT Education - 10/15/17 0859    Education provided  Yes    Education Details  HEP;  ktape info    Person(s) Educated  Patient     Methods  Explanation;Handout    Comprehension  Verbalized understanding;Returned demonstration          PT Long Term Goals - 10/10/17 1615      PT LONG TERM GOAL #1   Title  I with advanced HEP ( 10/31/17)     Status  On-going      PT LONG TERM GOAL #2   Title  improve FOTO =/< 28% limited ( 10/31/17)     Status  On-going      PT LONG TERM GOAL #3   Title  report =/> 75% reduction in rt leg pain/spasms with daily activity ( 10/31/17)      Status  On-going 50% improvement      PT LONG TERM GOAL #4   Title  improve Rt quad flexibility to within 5 degrees of Lt ( 10/31/17)     Status  On-going      PT LONG TERM GOAL #5   Title  improve Rt hip and knee strenth =/> 5-/5 (10/31/17)     Status  Partially Met            Plan - 10/15/17 0854    Clinical Impression Statement  Pt's Rt knee ROM and quad flexibility have improved.  She continues to have trigger points along Rt lateral thigh and hip; improved tissue extensibility with manual therapy.  Pt was challenged with Rt single leg sit to stand; added single leg bridge for added strengtheing. Progressing towards goals.     Rehab Potential  Excellent    PT Frequency  2x / week    PT Duration  4 weeks    PT Treatment/Interventions  Iontophoresis 52m/ml Dexamethasone;Dry needling;Manual techniques;Moist Heat;Cryotherapy;ELobbyistTherapeutic activities;Therapeutic exercise;Ultrasound;Patient/family education    PT Next Visit Plan  continue eccentric quad/hamstring strengthening for RLE.  manual therapy as indicated.  assess response to tape.     Consulted and Agree with Plan of Care  Patient       Patient will benefit from skilled therapeutic intervention in order to improve the following deficits and impairments:  Pain, Increased muscle spasms, Impaired flexibility, Decreased strength  Visit Diagnosis: Pain in right leg  Other muscle spasm  Muscle weakness (generalized)     Problem List Patient  Active Problem List   Diagnosis Date Noted  . Right thigh pain 09/24/2017  . Muscle cramps 09/24/2017  . Incontinence of feces with fecal urgency 09/24/2017  . Paresthesia 08/08/2017  . Vitamin D deficiency 08/08/2017  . Seborrheic keratosis 08/08/2017  . Left hip pain 06/12/2016  . Dyslipidemia 06/11/2016  . Cystocele 08/22/2015  . Absence of bladder continence 08/22/2015  . Hematuria 08/22/2015  . Vaginal dryness, menopausal 08/22/2015  . QT prolongation 01/28/2015  . Double vision 01/24/2015  . Hypertriglyceridemia 05/02/2014  . Overweight (BMI 25.0-29.9) 02/19/2014  . Depression 10/26/2013  . Stress at home 10/26/2013  . Essential hypertension, benign 10/26/2013  . Allergic rhinitis 10/26/2013   Kerin Perna, PTA 10/15/17 9:16 AM  [facility] [place], [place], [address] Phone: [phone]   Fax:  [phone]  Name: Melanie Hart MRN: [id number] Date of Birth: 1961/04/04

## 2017-10-15 NOTE — Patient Instructions (Addendum)
Bridge Pose, One Leg    Bring knee to chest. Roll up from tailbone to bridge pose on supporting leg. Focus on engaging posterior hip muscles.  Repeat _10___ times each leg.  Mini Squat: Single Leg    Stand on right foot. Reach forward for balance and do a mini squat. Keep knees in line with second toe. Knees do not go past toes. Keep knees together. Repeat _5__ times. Repeat with other leg for set. Rest _60__ seconds after set. Do _2__ sets per session.  Kinesiology tape What is kinesiology tape?  There are many brands of kinesiology tape.  KTape, [employer], [employer], Dynamic tape, to name a few. It is an elasticized tape designed to support the body's natural healing process. This tape provides stability and support to muscles and joints without restricting motion. It can also help decrease swelling in the area of application. How does it work? The tape microscopically lifts and decompresses the skin to allow for drainage of lymph (swelling) to flow away from area, reducing inflammation.  The tape has the ability to help re-educate the neuromuscular system by targeting specific receptors in the skin.  The presence of the tape increases the body's awareness of posture and body mechanics.  Do not use with: . Open wounds . Skin lesions . Adhesive allergies Safe removal of the tape: In some rare cases, mild/moderate skin irritation can occur.  This can include redness, itchiness, or hives. If this occurs, immediately remove tape and consult your primary care physician if symptoms are severe or do not resolve within 2 days.  To remove tape safely, hold nearby skin with one hand and gentle roll tape down with other hand.  You can apply oil or conditioner to tape while in shower prior to removal to loosen adhesive.  DO NOT swiftly rip tape off like a band-aid, as this could cause skin tears and additional skin irritation.

## 2017-10-17 ENCOUNTER — Ambulatory Visit (INDEPENDENT_AMBULATORY_CARE_PROVIDER_SITE_OTHER): Payer: 59 | Admitting: Physical Therapy

## 2017-10-17 ENCOUNTER — Encounter: Payer: Self-pay | Admitting: Physical Therapy

## 2017-10-17 DIAGNOSIS — M62838 Other muscle spasm: Secondary | ICD-10-CM | POA: Diagnosis not present

## 2017-10-17 DIAGNOSIS — M79604 Pain in right leg: Secondary | ICD-10-CM | POA: Diagnosis not present

## 2017-10-17 DIAGNOSIS — M6281 Muscle weakness (generalized): Secondary | ICD-10-CM

## 2017-10-17 NOTE — Therapy (Signed)
[place], [place], [address] Phone: [phone]   Fax:  [phone]  Physical Therapy Treatment  Patient Details  Name: Melanie Hart MRN: [id number] Date of Birth: September 11, 1960 Referring Provider: Iran Planas, PA   Encounter Date: 10/17/2017  PT End of Session - 10/17/17 1406    Visit Number  4    Number of Visits  8    Date for PT Re-Evaluation  10/31/17    PT Start Time  1406    PT Stop Time  1451    PT Time Calculation (min)  45 min    Activity Tolerance  Patient tolerated treatment well       Past Medical History:  Diagnosis Date  . Hypertension    not currently on meds    Past Surgical History:  Procedure Laterality Date  . INCONTINENCE SURGERY      There were no vitals filed for this visit.  Subjective Assessment - 10/17/17 1407    Subjective  Melanie Hart reports she has had a couple good days. however last night standing in line to return something and had a lot of pain along the outside of the Rt thigh. Cramping episodes are decreasing    Patient Stated Goals  rehab the muscle if it is damaged and make it stop cramping.     Currently in Pain?  No/denies         [facility] PT Assessment - 10/17/17 0001      Palpation   Spinal mobility  performed grade III lumbar and sacral mobs, CPA & bilat UPA, no reports of pain by pt, did have some hypomobility around L5-3, no change in leg symptoms with this                   [facility] Adult PT Treatment/Exercise - 10/17/17 0001      Knee/Hip Exercises: Stretches   Piriformis Stretch  Both;30 seconds in figure 4      Knee/Hip Exercises: Aerobic   Stationary Bike  L2x5'      Knee/Hip Exercises: Standing   Hip Flexion  Stengthening;Both;10 reps;Knee bent green band, focus on eccentric motions    Hip Abduction  Stengthening;Both;10 reps;Knee straight green band, focus on eccentric contraction    Abduction Limitations  pt developed  some tingling into the Lateral Rt LE,     Other Standing Knee Exercises  high kneel to/from stand alternating legs 2x8 reps,      Knee/Hip Exercises: Prone   Other Prone Exercises  POE and 2x5 reps press ups - this settled down some of her leg sypmtoms      Modalities   Modalities  Ultrasound      Ultrasound   Ultrasound Location  100% 1.73mz, 1.5 w/cm2    Ultrasound Parameters  Rt distal and proximal ITB    Ultrasound Goals  Pain      Manual Therapy   Manual therapy comments  I strip of Rock tape placed along Rt lateral thigh (knee to hip) with 10% stretch, to decmpress tissue, decrease pain, increase proprioception.  small I strip placed perpendicular at distal ITB with 75% stretch                   PT Long Term Goals - 10/10/17 1615      PT LONG TERM GOAL #1   Title  I with advanced HEP ( 10/31/17)     Status  On-going  PT LONG TERM GOAL #2   Title  improve FOTO =/< 28% limited ( 10/31/17)     Status  On-going      PT LONG TERM GOAL #3   Title  report =/> 75% reduction in rt leg pain/spasms with daily activity ( 10/31/17)      Status  On-going 50% improvement      PT LONG TERM GOAL #4   Title  improve Rt quad flexibility to within 5 degrees of Lt ( 10/31/17)     Status  On-going      PT LONG TERM GOAL #5   Title  improve Rt hip and knee strenth =/> 5-/5 (10/31/17)     Status  Partially Met            Plan - 10/17/17 1451    Clinical Impression Statement  Pt has noticed a lot of decrease in her pain /cramping lately.  Fatigued quickly with higher level exercise.  Did not need DN at this time.     Rehab Potential  Excellent    PT Frequency  2x / week    PT Duration  4 weeks    PT Treatment/Interventions  Iontophoresis '4mg'$ /ml Dexamethasone;Dry needling;Manual techniques;Moist Heat;Cryotherapy;Lobbyist;Therapeutic activities;Therapeutic exercise;Ultrasound;Patient/family education    PT Next Visit Plan  assess response to [place] ,  cont tapin gPRN    Consulted and Agree with Plan of Care  Patient       Patient will benefit from skilled therapeutic intervention in order to improve the following deficits and impairments:  Pain, Increased muscle spasms, Impaired flexibility, Decreased strength  Visit Diagnosis: Pain in right leg  Other muscle spasm  Muscle weakness (generalized)     Problem List Patient Active Problem List   Diagnosis Date Noted  . Right thigh pain 09/24/2017  . Muscle cramps 09/24/2017  . Incontinence of feces with fecal urgency 09/24/2017  . Paresthesia 08/08/2017  . Vitamin D deficiency 08/08/2017  . Seborrheic keratosis 08/08/2017  . Left hip pain 06/12/2016  . Dyslipidemia 06/11/2016  . Cystocele 08/22/2015  . Absence of bladder continence 08/22/2015  . Hematuria 08/22/2015  . Vaginal dryness, menopausal 08/22/2015  . QT prolongation 01/28/2015  . Double vision 01/24/2015  . Hypertriglyceridemia 05/02/2014  . Overweight (BMI 25.0-29.9) 02/19/2014  . Depression 10/26/2013  . Stress at home 10/26/2013  . Essential hypertension, benign 10/26/2013  . Allergic rhinitis 10/26/2013    Jeral Pinch PT  10/17/2017, 2:54 PM  [facility] [place], [place], [address] Phone: [phone]   Fax:  [phone]  Name: Melanie Hart MRN: [id number] Date of Birth: 1960-06-30

## 2017-10-22 ENCOUNTER — Ambulatory Visit (INDEPENDENT_AMBULATORY_CARE_PROVIDER_SITE_OTHER): Payer: 59 | Admitting: Physical Therapy

## 2017-10-22 DIAGNOSIS — M6281 Muscle weakness (generalized): Secondary | ICD-10-CM | POA: Diagnosis not present

## 2017-10-22 DIAGNOSIS — M79604 Pain in right leg: Secondary | ICD-10-CM | POA: Diagnosis not present

## 2017-10-22 DIAGNOSIS — M62838 Other muscle spasm: Secondary | ICD-10-CM

## 2017-10-22 NOTE — Therapy (Signed)
[place], [place], [address] Phone: [phone]   Fax:  [phone]  Physical Therapy Treatment  Patient Details  Name: Melanie Hart MRN: [id number] Date of Birth: 1961-04-05 Referring Provider: Iran Planas, PA-C   Encounter Date: 10/22/2017  PT End of Session - 10/22/17 0900    Visit Number  5    Number of Visits  8    Date for PT Re-Evaluation  10/31/17    PT Start Time  0370 pt arrived late    PT Stop Time  0931    PT Time Calculation (min)  33 min       Past Medical History:  Diagnosis Date  . Hypertension    not currently on meds    Past Surgical History:  Procedure Laterality Date  . INCONTINENCE SURGERY      There were no vitals filed for this visit.  Subjective Assessment - 10/22/17 0903    Subjective  Pt reports walking is getting better, but standing still is "still rough".   Her legs cramp is she does too much.     Currently in Pain?  No/denies    Pain Score  0-No pain         [facility] PT Assessment - 10/22/17 0001      Assessment   Medical Diagnosis  Rt thigh pain    Referring Provider  Iran Planas, PA-C    Onset Date/Surgical Date  08/22/17    Hand Dominance  Right    Next MD Visit  after PT      Strength   Right Knee Flexion  -- 5-/5      Flexibility   Quadriceps  Rt 134 deg; Lt 140 deg.        [facility] Adult PT Treatment/Exercise - 10/22/17 0001      Knee/Hip Exercises: Stretches   Passive Hamstring Stretch  Right;2 reps;30 seconds    Quad Stretch  Right;Left;3 reps;30 seconds    ITB Stretch  Right;2 reps;20 seconds    Piriformis Stretch  Right;3 reps;30 seconds      Knee/Hip Exercises: Aerobic   Stationary Bike  L2x5'      Knee/Hip Exercises: Supine   Single Leg Bridge  Strengthening;Right;1 set;15 reps 1 set of 5 with RLE on green therapy ball.     Other Supine Knee/Hip Exercises  reverse hamstring curls with legs on green therapy ball x 8 reps        Manual Therapy   Manual therapy comments  I strip of Rock tape placed along Rt mid-lateral thigh with 10% stretch, to decompress tissue, decrease pain, increase proprioception.  small I strip placed perpendicular at distal ITB with 75% stretch. Additional I strip placed with 10% stretch over distal Rt glute.      Soft tissue mobilization  Edge tool assistance to Rt distal to prox quad, lateral Rt hip to decrease fascial restrictions and improved Rt knee/hip mobility.                   PT Long Term Goals - 10/22/17 0904      PT LONG TERM GOAL #1   Title  I with advanced HEP ( 10/31/17)     Time  4    Period  Weeks    Status  On-going      PT LONG TERM GOAL #2   Title  improve FOTO =/< 28% limited ( 10/31/17)     Time  4  Period  Weeks    Status  On-going      PT LONG TERM GOAL #3   Title  report =/> 75% reduction in rt leg pain/spasms with daily activity ( 10/31/17)      Time  4    Period  Weeks    Status  On-going      PT LONG TERM GOAL #4   Title  improve Rt quad flexibility to within 5 degrees of Lt ( 10/31/17)     Time  4    Period  Weeks    Status  On-going      PT LONG TERM GOAL #5   Title  improve Rt hip and knee strenth =/> 5-/5 (10/31/17)     Time  4    Period  Weeks    Status  Partially Met            Plan - 10/22/17 1315    Clinical Impression Statement  Continued improvement in Rt quad flexibility.  Palpable tightness in Rt lateral mid-to prox quad and glute; improved with IASTM.  She tolerated strengthening/stretching exercises well, without incresae in symptoms.     Rehab Potential  Excellent    PT Frequency  2x / week    PT Duration  4 weeks    PT Treatment/Interventions  Iontophoresis '4mg'$ /ml Dexamethasone;Dry needling;Manual techniques;Moist Heat;Cryotherapy;Lobbyist;Therapeutic activities;Therapeutic exercise;Ultrasound;Patient/family education    PT Next Visit Plan  continue manual therapy to RLE.       Consulted and Agree with Plan of Care  Patient       Patient will benefit from skilled therapeutic intervention in order to improve the following deficits and impairments:  Pain, Increased muscle spasms, Impaired flexibility, Decreased strength  Visit Diagnosis: Pain in right leg  Other muscle spasm  Muscle weakness (generalized)     Problem List Patient Active Problem List   Diagnosis Date Noted  . Right thigh pain 09/24/2017  . Muscle cramps 09/24/2017  . Incontinence of feces with fecal urgency 09/24/2017  . Paresthesia 08/08/2017  . Vitamin D deficiency 08/08/2017  . Seborrheic keratosis 08/08/2017  . Left hip pain 06/12/2016  . Dyslipidemia 06/11/2016  . Cystocele 08/22/2015  . Absence of bladder continence 08/22/2015  . Hematuria 08/22/2015  . Vaginal dryness, menopausal 08/22/2015  . QT prolongation 01/28/2015  . Double vision 01/24/2015  . Hypertriglyceridemia 05/02/2014  . Overweight (BMI 25.0-29.9) 02/19/2014  . Depression 10/26/2013  . Stress at home 10/26/2013  . Essential hypertension, benign 10/26/2013  . Allergic rhinitis 10/26/2013   Kerin Perna, PTA 10/22/17 1:17 PM  [facility] [place], [place], [address] Phone: [phone]   Fax:  [phone]  Name: Melanie Hart MRN: [id number] Date of Birth: [birth year]

## 2017-10-25 ENCOUNTER — Ambulatory Visit (INDEPENDENT_AMBULATORY_CARE_PROVIDER_SITE_OTHER): Payer: 59 | Admitting: Physical Therapy

## 2017-10-25 DIAGNOSIS — M6281 Muscle weakness (generalized): Secondary | ICD-10-CM

## 2017-10-25 DIAGNOSIS — M62838 Other muscle spasm: Secondary | ICD-10-CM

## 2017-10-25 DIAGNOSIS — M79604 Pain in right leg: Secondary | ICD-10-CM

## 2017-10-25 NOTE — Therapy (Signed)
[place] [address] [place], [place], [address] Phone: [phone]   Fax:  [phone]  Physical Therapy Treatment  Patient Details  Name: Melanie Hart MRN: [id number] Date of Birth: 21-Apr-1961 Referring Provider: Iran Planas, PA-C   Encounter Date: 10/25/2017  PT End of Session - 10/25/17 1343    Visit Number  6    Number of Visits  8    Date for PT Re-Evaluation  10/31/17    PT Start Time  5638    PT Stop Time  1428    PT Time Calculation (min)  55 min    Activity Tolerance  Patient tolerated treatment well    Behavior During Therapy  [facility] for tasks assessed/performed       Past Medical History:  Diagnosis Date  . Hypertension    not currently on meds    Past Surgical History:  Procedure Laterality Date  . INCONTINENCE SURGERY      There were no vitals filed for this visit.  Subjective Assessment - 10/25/17 1343    Subjective  "it's been a rough 2 days".  Pt reports her leg has been cramping again more freq over last couple days.  She reports despite the flare up,she reports 50% improvement since starting therapy.      Patient Stated Goals  rehab the muscle if it is damaged and make it stop cramping.     Currently in Pain?  Yes    Pain Score  4     Pain Location  Leg    Pain Orientation  Right;Lateral;Distal    Pain Descriptors / Indicators  Aching;Tightness;Sore    Aggravating Factors   prolonged sitting, after stopping walking    Pain Relieving Factors  time, rest, self massage.          [facility] PT Assessment - 10/25/17 0001      Assessment   Medical Diagnosis  Rt thigh pain    Referring Provider  Iran Planas, PA-C    Onset Date/Surgical Date  08/22/17    Hand Dominance  Right    Next MD Visit  after PT      Flexibility   Quadriceps  Rt 137 deg.        [facility] Adult PT Treatment/Exercise - 10/25/17 0001      Knee/Hip Exercises: Stretches   Passive Hamstring Stretch  Right;2 reps;30  seconds    Quad Stretch  Right;Left;3 reps;30 seconds    ITB Stretch  Right;2 reps;30 seconds    Piriformis Stretch  Right;3 reps;30 seconds      Knee/Hip Exercises: Aerobic   Stationary Bike  L2x5'      Knee/Hip Exercises: Standing   Other Standing Knee Exercises  single leg sit to stand x 5 reps, 3 sets (elevated table, light toe touch with LLE)- 1 set of 10 on LLE- repeated in semi-tandem stance sit to stand in chair x 3 reps each leg.       Knee/Hip Exercises: Supine   Single Leg Bridge  Strengthening;Right;Left;1 set;10 reps      Knee/Hip Exercises: Sidelying   Hip ABduction  Strengthening;Right;1 set;10 reps    Other Sidelying Knee/Hip Exercises  Pilates leg circles RLE x 10 CW/CCW      Moist Heat Therapy   Number Minutes Moist Heat  15 Minutes    Moist Heat Location  -- Rt thigh and hip      Electrical Stimulation   Electrical Stimulation Location  Lateral Rt thigh  and hip    Electrical Stimulation Action  IFC    Electrical Stimulation Parameters   to tolerance    Electrical Stimulation Goals  Pain;Tone      Manual Therapy   Soft tissue mobilization  Edge tool assistance to Rt distal to prox quad, lateral Rt hip to decrease fascial restrictions and improved Rt knee/hip mobility.   TPR to Rt glute/ piriformis; pin and stretch to Rt distal lateral hamstring.                   PT Long Term Goals - 10/25/17 1427      PT LONG TERM GOAL #1   Title  I with advanced HEP ( 10/31/17)     Time  4    Period  Weeks    Status  On-going      PT LONG TERM GOAL #2   Title  improve FOTO =/< 28% limited ( 10/31/17)     Time  4    Period  Weeks    Status  On-going      PT LONG TERM GOAL #3   Title  report =/> 75% reduction in rt leg pain/spasms with daily activity ( 10/31/17)      Time  4    Period  Weeks    Status  On-going      PT LONG TERM GOAL #4   Title  improve Rt quad flexibility to within 5 degrees of Lt ( 10/31/17)     Time  4    Period  Weeks    Status   Achieved      PT LONG TERM GOAL #5   Title  improve Rt hip and knee strenth =/> 5-/5 (10/31/17)     Time  4    Period  Weeks    Status  Partially Met            Plan - 10/25/17 1416    Clinical Impression Statement  Pt reported reduced pain and tightness in RLE after manual therapy.  Pt demonstrated improved ability to perform single sit to stand in semi-tandem stance.  She continues with trigger points along lateral portion of Rt thigh.  He has met LTG #4 and is Progressing towards goals.     Rehab Potential  Excellent    PT Frequency  2x / week    PT Duration  4 weeks    PT Treatment/Interventions  Iontophoresis '4mg'$ /ml Dexamethasone;Dry needling;Manual techniques;Moist Heat;Cryotherapy;Lobbyist;Therapeutic activities;Therapeutic exercise;Ultrasound;Patient/family education    PT Next Visit Plan  continue manual therapy to RLE.  progress strengthening as tolerated.     Consulted and Agree with Plan of Care  Patient       Patient will benefit from skilled therapeutic intervention in order to improve the following deficits and impairments:  Pain, Increased muscle spasms, Impaired flexibility, Decreased strength  Visit Diagnosis: Pain in right leg  Other muscle spasm  Muscle weakness (generalized)     Problem List Patient Active Problem List   Diagnosis Date Noted  . Right thigh pain 09/24/2017  . Muscle cramps 09/24/2017  . Incontinence of feces with fecal urgency 09/24/2017  . Paresthesia 08/08/2017  . Vitamin D deficiency 08/08/2017  . Seborrheic keratosis 08/08/2017  . Left hip pain 06/12/2016  . Dyslipidemia 06/11/2016  . Cystocele 08/22/2015  . Absence of bladder continence 08/22/2015  . Hematuria 08/22/2015  . Vaginal dryness, menopausal 08/22/2015  . QT prolongation 01/28/2015  . Double vision 01/24/2015  . Hypertriglyceridemia  05/02/2014  . Overweight (BMI 25.0-29.9) 02/19/2014  . Depression 10/26/2013  . Stress at home  10/26/2013  . Essential hypertension, benign 10/26/2013  . Allergic rhinitis 10/26/2013   Kerin Perna, PTA 10/25/17 2:44 PM  [place], [place], [address] Phone: [phone]   Fax:  [phone]  Name: Melanie Hart MRN: [id number] Date of Birth: 01-13-1961

## 2017-10-31 ENCOUNTER — Encounter: Payer: 59 | Admitting: Physical Therapy

## 2017-11-05 ENCOUNTER — Ambulatory Visit: Payer: 59 | Admitting: Physical Therapy

## 2017-11-05 DIAGNOSIS — M6281 Muscle weakness (generalized): Secondary | ICD-10-CM | POA: Diagnosis not present

## 2017-11-05 DIAGNOSIS — M79604 Pain in right leg: Secondary | ICD-10-CM | POA: Diagnosis not present

## 2017-11-05 DIAGNOSIS — M62838 Other muscle spasm: Secondary | ICD-10-CM

## 2017-11-05 NOTE — Patient Instructions (Signed)
Side Leg Circle    Lie on side, back straight along edge of mat, legs 30 in front of torso. Lift top leg to hip height. Rotate in small circle, ___10-20_ times in each direction. Repeat _2___ times. Repeat on other side. Do __3__ sessions per week.

## 2017-11-05 NOTE — Therapy (Addendum)
[place], [place], [address] Phone: [phone]   Fax:  [phone]  Physical Therapy Treatment  Patient Details  Name: Melanie Hart MRN: [id number] Date of Birth: 12-09-1960 Referring Provider: Iran Planas, PA-C   Encounter Date: 11/05/2017  PT End of Session - 11/05/17 1205    Visit Number  7    Number of Visits  8    PT Start Time  1150    PT Stop Time  1230    PT Time Calculation (min)  40 min       Past Medical History:  Diagnosis Date  . Hypertension    not currently on meds    Past Surgical History:  Procedure Laterality Date  . INCONTINENCE SURGERY      There were no vitals filed for this visit.  Subjective Assessment - 11/05/17 1147    Subjective  Pt reports she no longer has cramping in her RLE.  She has occasional pain in Rt thigh, after standing too long.  "I've had a lot of improvement".     Patient Stated Goals  rehab the muscle if it is damaged and make it stop cramping.     Currently in Pain?  No/denies    Pain Score  0-No pain         [facility] PT Assessment - 11/05/17 0001      Assessment   Medical Diagnosis  Rt thigh pain    Referring Provider  Iran Planas, PA-C    Onset Date/Surgical Date  08/22/17    Hand Dominance  Right    Next MD Visit  after PT      Strength   Right Knee Flexion  -- 5-/5      Flexibility   Quadriceps  Rt 140 deg; Lt        [facility] Adult PT Treatment/Exercise - 11/05/17 0001      Knee/Hip Exercises: Stretches   Passive Hamstring Stretch  Right;Left; 2 reps;30 seconds    Quad Stretch  Right;Left;3 reps;30 seconds    ITB Stretch  Right;Left;2 reps;20 seconds    Piriformis Stretch  Right; Left; 3 reps;30 seconds      Knee/Hip Exercises: Aerobic   Stationary Bike  L2x5'      Knee/Hip Exercises: Standing   Other Standing Knee Exercises  single leg sit to stand x 5 reps, 2 sets with RLE.  1 set with LLE.     Knee/Hip Exercises:  Supine   Single Leg Bridge  Strengthening;Right;Left;5 reps    Other Supine Knee/Hip Exercises  reverse hamstring curls with legs on green therapy ball x 10 reps       Knee/Hip Exercises: Sidelying   Other Sidelying Knee/Hip Exercises  Pilates leg circles RLE/LE x 10 CW/CCW      Manual Therapy   Soft tissue mobilization  Edge tool assistance to Rt distal to prox quad, lateral Rt hip to decrease fascial restrictions and improved Rt knee/hip mobility. pin and stretch to Rt distal lateral hamstring.                   PT Long Term Goals - 11/05/17 1257      PT LONG TERM GOAL #1   Title  I with advanced HEP ( 10/31/17)     Time  4    Period  Weeks    Status  On-going      PT LONG TERM GOAL #2   Title  improve  FOTO =/< 28% limited ( 10/31/17)     Time  4    Period  Weeks    Status  On-going      PT LONG TERM GOAL #3   Title  report =/> 75% reduction in rt leg pain/spasms with daily activity ( 10/31/17)      Time  4    Period  Weeks    Status  Achieved      PT LONG TERM GOAL #4   Title  improve Rt quad flexibility to within 5 degrees of Lt ( 10/31/17)     Time  4    Period  Weeks    Status  Achieved      PT LONG TERM GOAL #5   Title  improve Rt hip and knee strenth =/> 5-/5 (10/31/17)     Time  4    Period  Weeks    Status  Achieved            Plan - 11/05/17 1253    Clinical Impression Statement  Pt tolerated all exercises well without increase in symptoms.  Her Rt quad flexibility has improved since initial eval.   She has some continued fascial tightness in Rt prox quad and lateral hamstring; improved with manual therapy.  She has met LTG#3 and 5.  Pt requests to hold for 2 wks while she continues to work on her HEP.     Rehab Potential  Excellent    PT Frequency  2x / week    PT Duration  4 weeks    PT Treatment/Interventions  Iontophoresis '4mg'$ /ml Dexamethasone;Dry needling;Manual techniques;Moist Heat;Cryotherapy;Camera operator;Therapeutic activities;Therapeutic exercise;Ultrasound;Patient/family education    PT Next Visit Plan  hold 2 wks per pt request.  If pt doesn't return by 6/26, will d/c.      Consulted and Agree with Plan of Care  Patient       Patient will benefit from skilled therapeutic intervention in order to improve the following deficits and impairments:  Pain, Increased muscle spasms, Impaired flexibility, Decreased strength  Visit Diagnosis: Pain in right leg  Other muscle spasm  Muscle weakness (generalized)     Problem List Patient Active Problem List   Diagnosis Date Noted  . Right thigh pain 09/24/2017  . Muscle cramps 09/24/2017  . Incontinence of feces with fecal urgency 09/24/2017  . Paresthesia 08/08/2017  . Vitamin D deficiency 08/08/2017  . Seborrheic keratosis 08/08/2017  . Left hip pain 06/12/2016  . Dyslipidemia 06/11/2016  . Cystocele 08/22/2015  . Absence of bladder continence 08/22/2015  . Hematuria 08/22/2015  . Vaginal dryness, menopausal 08/22/2015  . QT prolongation 01/28/2015  . Double vision 01/24/2015  . Hypertriglyceridemia 05/02/2014  . Overweight (BMI 25.0-29.9) 02/19/2014  . Depression 10/26/2013  . Stress at home 10/26/2013  . Essential hypertension, benign 10/26/2013  . Allergic rhinitis 10/26/2013   Kerin Perna, PTA 11/05/17 1:07 PM  [place], [place], [address] Phone: [phone]   Fax:  [phone]  Name: Melanie Hart MRN: [id number] Date of Birth: May 29, 1960   PHYSICAL THERAPY DISCHARGE SUMMARY  Visits from Start of Care: 7 Current functional level related to goals / functional outcomes: See above    Remaining deficits: unknown   Education / Equipment: HEP Plan: Patient agrees to discharge.  Patient goals were partially met. Patient is being discharged due to being pleased with the current functional level.  Pt was on hold and  was to  call and schedule if needed. ?????     Jeral Pinch, PT 12/31/17 8:34 AM

## 2017-11-08 MED FILL — LOSARTAN POTASSIUM-HCTZ 50-: 50-12.5 | 90 days supply | Qty: 90 | Fill #1

## 2017-12-05 ENCOUNTER — Ambulatory Visit: Payer: 59

## 2017-12-13 ENCOUNTER — Ambulatory Visit (INDEPENDENT_AMBULATORY_CARE_PROVIDER_SITE_OTHER): Payer: 59 | Admitting: Physician Assistant

## 2017-12-13 VITALS — BP 116/64 | HR 65 | Temp 98.5°F

## 2017-12-13 DIAGNOSIS — Z23 Encounter for immunization: Secondary | ICD-10-CM

## 2017-12-13 NOTE — Progress Notes (Signed)
Pt came into clinic today for first Shingrix vaccine. Pt reports no allergies to eggs, and no recent illness. Pt tolerated injection in left deltoid well. Went over in great detail potential side effects. Advised to contact clinic with any questions or concerns and to schedule her next injection in 2-6 months. Verbalized understanding.   Agree with above plan. Iran Planas PA-C

## 2018-01-17 MED FILL — VASCEPA 1 GM CAPSULE: 1 | 90 days supply | Qty: 360 | Fill #1

## 2018-02-12 MED FILL — LOSARTAN-HCTZ 50-12.5 MG TA: 50-12.5 | 90 days supply | Qty: 90 | Fill #2

## 2018-02-13 ENCOUNTER — Ambulatory Visit (INDEPENDENT_AMBULATORY_CARE_PROVIDER_SITE_OTHER): Payer: 59 | Admitting: Family Medicine

## 2018-02-13 VITALS — Temp 97.8°F

## 2018-02-13 DIAGNOSIS — Z23 Encounter for immunization: Secondary | ICD-10-CM

## 2018-02-13 NOTE — Progress Notes (Signed)
Pt presents to office for second Shingrix. Did not report any side effects from first injection.  Pt tolerated injection well in right deltoid.

## 2018-02-13 NOTE — Progress Notes (Signed)
Agree with documentation as above.   Catherine Metheney, MD  

## 2018-05-12 MED FILL — LOSARTAN-HCTZ 50-12.5 MG TA: 50-12.5 | 30 days supply | Qty: 30 | Fill #3

## 2018-05-12 MED FILL — VASCEPA 1 GM CAPSULE: 1 | 90 days supply | Qty: 360 | Fill #2

## 2018-06-16 MED FILL — HYDROCHLOROTHIAZIDE 12.5 MG: 12.5 | 30 days supply | Qty: 30 | Fill #0

## 2018-06-16 MED FILL — LOSARTAN POTASSIUM 50 MG TA: 50 | 30 days supply | Qty: 30 | Fill #0

## 2018-07-18 MED FILL — LOSARTAN POTASSIUM 50 MG TA: 50 | 30 days supply | Qty: 30 | Fill #1 | Status: TO

## 2018-07-18 MED FILL — HYDROCHLOROTHIAZIDE 12.5 MG: 12.5 | 30 days supply | Qty: 30 | Fill #1

## 2018-08-25 ENCOUNTER — Other Ambulatory Visit: Payer: Self-pay | Admitting: Physician Assistant

## 2018-08-25 NOTE — Telephone Encounter (Signed)
Needs appointment

## 2018-09-10 ENCOUNTER — Encounter: Payer: 59 | Admitting: Physician Assistant

## 2018-10-07 ENCOUNTER — Telehealth: Payer: Self-pay | Admitting: Physician Assistant

## 2018-10-07 ENCOUNTER — Encounter: Payer: 59 | Admitting: Physician Assistant

## 2018-10-07 DIAGNOSIS — Z131 Encounter for screening for diabetes mellitus: Secondary | ICD-10-CM

## 2018-10-07 DIAGNOSIS — Z13220 Encounter for screening for lipoid disorders: Secondary | ICD-10-CM

## 2018-10-07 DIAGNOSIS — Z0000 Encounter for general adult medical examination without abnormal findings: Secondary | ICD-10-CM

## 2018-10-07 DIAGNOSIS — Z1329 Encounter for screening for other suspected endocrine disorder: Secondary | ICD-10-CM

## 2018-10-07 NOTE — Telephone Encounter (Signed)
The basic labs are lipid, cmp(glucose, liver, kidney), thyroid is she wanting anything else?

## 2018-10-07 NOTE — Telephone Encounter (Signed)
Pt rescheduled her CPE with Iran Planas for May 22nd and she would like for [facility] to send her labs order to lab so she can get them completed before her appt time

## 2018-10-07 NOTE — Addendum Note (Signed)
Addended by: Donella Stade on: 10/07/2018 10:33 AM   Modules accepted: Orders

## 2018-10-07 NOTE — Telephone Encounter (Signed)
Ok labs ordered 

## 2018-10-07 NOTE — Telephone Encounter (Signed)
She did not request any labs in particular, she just wanted labs before her Physical appt and asked that I let you know

## 2018-10-07 NOTE — Telephone Encounter (Signed)
Pt advised.

## 2018-10-08 DIAGNOSIS — G5601 Carpal tunnel syndrome, right upper limb: Secondary | ICD-10-CM | POA: Diagnosis not present

## 2018-10-17 ENCOUNTER — Ambulatory Visit (INDEPENDENT_AMBULATORY_CARE_PROVIDER_SITE_OTHER): Payer: 59 | Admitting: Physician Assistant

## 2018-10-17 ENCOUNTER — Encounter: Payer: Self-pay | Admitting: Physician Assistant

## 2018-10-17 VITALS — BP 135/65 | HR 70 | Temp 98.4°F | Ht 62.0 in | Wt 157.9 lb

## 2018-10-17 DIAGNOSIS — Z131 Encounter for screening for diabetes mellitus: Secondary | ICD-10-CM | POA: Diagnosis not present

## 2018-10-17 DIAGNOSIS — Z13220 Encounter for screening for lipoid disorders: Secondary | ICD-10-CM | POA: Diagnosis not present

## 2018-10-17 DIAGNOSIS — R030 Elevated blood-pressure reading, without diagnosis of hypertension: Secondary | ICD-10-CM

## 2018-10-17 DIAGNOSIS — Z1329 Encounter for screening for other suspected endocrine disorder: Secondary | ICD-10-CM

## 2018-10-17 DIAGNOSIS — Z1211 Encounter for screening for malignant neoplasm of colon: Secondary | ICD-10-CM | POA: Diagnosis not present

## 2018-10-17 DIAGNOSIS — D122 Benign neoplasm of ascending colon: Secondary | ICD-10-CM

## 2018-10-17 DIAGNOSIS — Z0000 Encounter for general adult medical examination without abnormal findings: Secondary | ICD-10-CM

## 2018-10-17 DIAGNOSIS — D126 Benign neoplasm of colon, unspecified: Secondary | ICD-10-CM | POA: Insufficient documentation

## 2018-10-17 NOTE — Patient Instructions (Signed)

## 2018-10-17 NOTE — Progress Notes (Signed)
Subjective:     Melanie Hart is a 58 y.o. female and is here for a comprehensive physical exam. The patient reports no problems    Social History   Socioeconomic History  . Marital status: Married    Spouse name: Not on file  . Number of children: Not on file  . Years of education: Not on file  . Highest education level: Not on file  Occupational History  . Not on file  Social Needs  . Financial resource strain: Not on file  . Food insecurity:    Worry: Not on file    Inability: Not on file  . Transportation needs:    Medical: Not on file    Non-medical: Not on file  Tobacco Use  . Smoking status: Never Smoker  . Smokeless tobacco: Never Used  Substance and Sexual Activity  . Alcohol use: Yes    Comment: once yearly  . Drug use: No  . Sexual activity: Yes  Lifestyle  . Physical activity:    Days per week: Not on file    Minutes per session: Not on file  . Stress: Not on file  Relationships  . Social connections:    Talks on phone: Not on file    Gets together: Not on file    Attends religious service: Not on file    Active member of club or organization: Not on file    Attends meetings of clubs or organizations: Not on file    Relationship status: Not on file  . Intimate partner violence:    Fear of current or ex partner: Not on file    Emotionally abused: Not on file    Physically abused: Not on file    Forced sexual activity: Not on file  Other Topics Concern  . Not on file  Social History Narrative  . Not on file   Health Maintenance  Topic Date Due  . HIV Screening  10/17/2019 (Originally 05/13/1978)  . INFLUENZA VACCINE  12/27/2018  . COLONOSCOPY  02/13/2019  . MAMMOGRAM  08/03/2019  . PAP SMEAR-Modifier  08/06/2020  . TETANUS/TDAP  08/26/2021  . Hepatitis C Screening  Completed    The following portions of the patient's history were reviewed and updated as appropriate: allergies, current medications, past family history, past medical history,  past social history, past surgical history and problem list.  Review of Systems A comprehensive review of systems was negative.   Objective:    BP 135/65   Pulse 70   Temp 98.4 F (36.9 C) (Oral)   Ht 5\' 2"  (1.575 m)   Wt 157 lb 14.4 oz (71.6 kg)   LMP 11/25/2013   BMI 28.88 kg/m  General appearance: alert, cooperative and appears stated age Head: Normocephalic, without obvious abnormality, atraumatic Eyes: conjunctivae/corneas clear. PERRL, EOM's intact. Fundi benign. Ears: normal TM's and external ear canals both ears Nose: Nares normal. Septum midline. Mucosa normal. No drainage or sinus tenderness. Throat: lips, mucosa, and tongue normal; teeth and gums normal Neck: no adenopathy, no carotid bruit, no JVD, supple, symmetrical, trachea midline and thyroid not enlarged, symmetric, no tenderness/mass/nodules Back: symmetric, no curvature. ROM normal. No CVA tenderness. Lungs: clear to auscultation bilaterally Heart: regular rate and rhythm, S1, S2 normal, no murmur, click, rub or gallop Abdomen: soft, non-tender; bowel sounds normal; no masses,  no organomegaly Extremities: extremities normal, atraumatic, no cyanosis or edema Pulses: 2+ and symmetric Skin: Skin color, texture, turgor normal. No rashes or lesions Lymph nodes: Cervical,  supraclavicular, and axillary nodes normal.    Assessment:    Healthy female exam.      Plan:    Melanie KitchenMarland KitchenSkarlet was seen today for annual exam.  Diagnoses and all orders for this visit:  Routine physical examination -     Lipid Panel w/reflex Direct LDL -     COMPLETE METABOLIC PANEL WITH GFR -     TSH  Screening for diabetes mellitus -     COMPLETE METABOLIC PANEL WITH GFR  Screening for lipid disorders -     Lipid Panel w/reflex Direct LDL  Screening for thyroid disorder -     TSH  Pre-hypertension  Adenomatous polyp of ascending colon -     Ambulatory referral to Gastroenterology  Colon cancer screening -     Ambulatory  referral to Gastroenterology   .Melanie Hart Depression screen [facility] 2/9 08/06/2017  Decreased Interest 0  Down, Depressed, Hopeless 0  PHQ - 2 Score 0  Altered sleeping 0  Tired, decreased energy 0  Change in appetite 0  Feeling bad or failure about yourself  0  Trouble concentrating 0  Moving slowly or fidgety/restless 1  Suicidal thoughts 0  PHQ-9 Score 1   .. Discussed 150 minutes of exercise a week.  Encouraged vitamin D 1000 units and Calcium 1300mg  or 4 servings of dairy a day.  Fasting labs ordered.  Colonoscopy due for sept this year. Order placed.  Mammogram up to date. Low risk agreed to every 2 years.  Pap up to date. Declined STD testing.  Shingles vaccine up to date.   Pt out of BP medications for 2 months. Checking BP at home and in the 120's over 80's. Will stop medications. Continue to monitor. If above 140/90 follow up.  See After Visit Summary for Counseling Recommendations

## 2018-10-29 DIAGNOSIS — H524 Presbyopia: Secondary | ICD-10-CM | POA: Diagnosis not present

## 2018-11-11 ENCOUNTER — Ambulatory Visit (INDEPENDENT_AMBULATORY_CARE_PROVIDER_SITE_OTHER): Payer: 59 | Admitting: Physician Assistant

## 2018-11-11 ENCOUNTER — Encounter: Payer: Self-pay | Admitting: Physician Assistant

## 2018-11-11 VITALS — BP 156/87 | HR 53 | Temp 98.4°F | Ht 62.0 in | Wt 156.0 lb

## 2018-11-11 DIAGNOSIS — J0140 Acute pansinusitis, unspecified: Secondary | ICD-10-CM

## 2018-11-11 DIAGNOSIS — R030 Elevated blood-pressure reading, without diagnosis of hypertension: Secondary | ICD-10-CM

## 2018-11-11 MED ORDER — METHYLPREDNISOLONE 4 MG PO TBPK: ORAL_TABLET | ORAL | 0 refills | Status: DC

## 2018-11-11 MED ORDER — AZITHROMYCIN 250 MG PO TABS: ORAL_TABLET | ORAL | 0 refills | Status: DC

## 2018-11-11 MED FILL — AZITHROMYCIN 250 MG TABLET: 250 | 5 days supply | Qty: 6 | Fill #0

## 2018-11-11 NOTE — Progress Notes (Signed)
Patient ID: Melanie Hart, female   DOB: 04/07/1961, 58 y.o.   MRN: [id number] .Marland KitchenVirtual Visit via Video Note  I connected with Melanie Hart on 11/12/18 at  2:00 PM EDT by a video enabled telemedicine application and verified that I am speaking with the correct person using two identifiers.  Location: Patient: home Provider: clinic   I discussed the limitations of evaluation and management by telemedicine and the availability of in person appointments. The patient expressed understanding and agreed to proceed.  History of Present Illness: Pt is a 58 yo female who calls into the clinic with 2-3 weeks of right ear pressure and sinus pressure. She is having some sinus drainage as well. She denies any cough, SOB, wheezing, fever, chills. Pt's daughter's boyfriend dad was dx with COVID yesterday. Daughter was outside with patient 1 week before dx. Pts daughter is not symptomatic. She has used flonase and mucinex without any relief. She has a lot of head pressure and congestion.   .. Active Ambulatory Problems    Diagnosis Date Noted  . Depression 10/26/2013  . Stress at home 10/26/2013  . Essential hypertension, benign 10/26/2013  . Allergic rhinitis 10/26/2013  . Overweight (BMI 25.0-29.9) 02/19/2014  . Hypertriglyceridemia 05/02/2014  . Double vision 01/24/2015  . QT prolongation 01/28/2015  . Cystocele 08/22/2015  . Absence of bladder continence 08/22/2015  . Hematuria 08/22/2015  . Vaginal dryness, menopausal 08/22/2015  . Dyslipidemia 06/11/2016  . Left hip pain 06/12/2016  . Paresthesia 08/08/2017  . Vitamin D deficiency 08/08/2017  . Seborrheic keratosis 08/08/2017  . Right thigh pain 09/24/2017  . Muscle cramps 09/24/2017  . Incontinence of feces with fecal urgency 09/24/2017  . Colon polyp 10/17/2018   Resolved Ambulatory Problems    Diagnosis Date Noted  . No Resolved Ambulatory Problems   Past Medical History:  Diagnosis Date  . Hypertension    Reviewed allergy,  medication, problem list.     Observations/Objective: No acute distress Normal breathing. Normal mood No cough.   .. Today's Vitals   11/11/18 1336 11/11/18 1345  BP: (!) 169/94 (!) 156/87  Pulse: (!) 53   Temp: 98.4 F (36.9 C)   TempSrc: Oral   Weight: 156 lb (70.8 kg)   Height: 5\' 2"  (1.575 m)    Body mass index is 28.53 kg/m.   Assessment and Plan: Marland KitchenMarland KitchenMillisa was seen today for nasal congestion.  Diagnoses and all orders for this visit:  Acute non-recurrent pansinusitis -     azithromycin (ZITHROMAX Z-PAK) 250 MG tablet; Take 2 tablets (500 mg) on  Day 1,  followed by 1 tablet (250 mg) once daily on Days 2 through 5. -     methylPREDNISolone (MEDROL DOSEPAK) 4 MG TBPK tablet; Take as directed by package insert.   Reassured patient that symptoms started before her potential contact with secondary contact to COVID positive patient and I do not think her symptoms represent COVID. Start zpak for sinusitis. Continue using flonase. Hold medrol dose pack but can start if continues to have right ear pain and pressure. Follow up as needed. Discussed more concerning symptoms of COVID if she starts to have these call office and will get set up for testing and will have to self isolate until results.   BP is elevated today. Pt was recently seen with normal BP. Continue to monitor this. If above 140/90 need to follow back up.   Follow Up Instructions:    I discussed the assessment and treatment plan with  the patient. The patient was provided an opportunity to ask questions and all were answered. The patient agreed with the plan and demonstrated an understanding of the instructions.   The patient was advised to call back or seek an in-person evaluation if the symptoms worsen or if the condition fails to improve as anticipated.   Iran Planas, PA-C

## 2018-11-11 NOTE — Progress Notes (Deleted)
Patient having trouble with right ear and pressure behind eyes/congestion. Having some post nasal drip. Symptoms started 2-3 weeks ago and getting worse. Having coughing from nasal drip. No fevers.  Patient's daughter has a boyfriend that has been to the house and his dad was diagnosed with Covid.

## 2018-11-12 ENCOUNTER — Encounter: Payer: Self-pay | Admitting: Physician Assistant

## 2018-12-26 ENCOUNTER — Encounter: Payer: Self-pay | Admitting: Internal Medicine

## 2018-12-29 ENCOUNTER — Telehealth: Payer: Self-pay | Admitting: Internal Medicine

## 2018-12-29 NOTE — Telephone Encounter (Signed)
Thank you Dr. Hilarie Fredrickson. I spoke with her and she is going to wait until your schedule for October is available. Thank you.

## 2018-12-29 NOTE — Telephone Encounter (Signed)
Hi Dr. Hilarie Fredrickson, this pt is due for a repeat colon, she had her last colon in 2015 with you. She is only available on Fridays but you will not be doing procedures on Fridays in September so she is willing to switch to another doctor who has openings on that day. Are you ok with the switch? Thank you.

## 2018-12-29 NOTE — Telephone Encounter (Signed)
That is fine with me I will have Friday availability in Oct is she prefers to wait until that month Either way, okay Thanks

## 2018-12-29 NOTE — Telephone Encounter (Signed)
Patient has been scheduled to see Dr Hilarie Fredrickson for colonoscopy on 02/27/19 at 130 pm with 1230 pm arrival. She is also scheduled for previsit on 02/13/19 at 800 am. She verbalizes understanding.

## 2019-01-26 ENCOUNTER — Encounter: Payer: Self-pay | Admitting: Physician Assistant

## 2019-01-26 ENCOUNTER — Telehealth: Payer: 59 | Admitting: Physician Assistant

## 2019-01-26 DIAGNOSIS — N390 Urinary tract infection, site not specified: Secondary | ICD-10-CM

## 2019-01-26 DIAGNOSIS — R319 Hematuria, unspecified: Secondary | ICD-10-CM

## 2019-01-26 MED ORDER — NITROFURANTOIN MONOHYD MACRO 100 MG PO CAPS: 100.0000 mg | ORAL_CAPSULE | Freq: Two times a day (BID) | ORAL | 0 refills | Status: DC

## 2019-01-26 MED FILL — NITROFURANTOIN MONO-MCR 100: 100 | 5 days supply | Qty: 10 | Fill #0

## 2019-01-26 NOTE — Progress Notes (Signed)
We are sorry that you are not feeling well.  Here is how we plan to help!  Based on what you shared with me it looks like you most likely have a simple urinary tract infection.  A UTI (Urinary Tract Infection) is a bacterial infection of the bladder.  Most cases of urinary tract infections are simple to treat but a key part of your care is to encourage you to drink plenty of fluids and watch your symptoms carefully.  I have prescribed MacroBid 100 mg twice a day for 5 days.  Your symptoms should gradually improve. Call us if the burning in your urine worsens, you develop worsening fever, back pain or pelvic pain or if your symptoms do not resolve after completing the antibiotic.  Urinary tract infections can be prevented by drinking plenty of water to keep your body hydrated.  Also be sure when you wipe, wipe from front to back and don't hold it in!  If possible, empty your bladder every 4 hours.  Your e-visit answers were reviewed by a board certified advanced clinical practitioner to complete your personal care plan.  Depending on the condition, your plan could have included both over the counter or prescription medications.  If there is a problem please reply  once you have received a response from your provider.  Your safety is important to us.  If you have drug allergies check your prescription carefully.    You can use MyChart to ask questions about today's visit, request a non-urgent call back, or ask for a work or school excuse for 24 hours related to this e-Visit. If it has been greater than 24 hours you will need to follow up with your provider, or enter a new e-Visit to address those concerns.   You will get an e-mail in the next two days asking about your experience.  I hope that your e-visit has been valuable and will speed your recovery. Thank you for using e-visits.   I spent 5-10 minutes on review and completion of this note- Sahar Osman PAC  

## 2019-02-20 ENCOUNTER — Ambulatory Visit (AMBULATORY_SURGERY_CENTER): Payer: Self-pay | Admitting: *Deleted

## 2019-02-20 ENCOUNTER — Other Ambulatory Visit: Payer: Self-pay

## 2019-02-20 ENCOUNTER — Encounter: Payer: Self-pay | Admitting: Internal Medicine

## 2019-02-20 VITALS — Temp 97.6°F | Ht 62.0 in | Wt 162.2 lb

## 2019-02-20 DIAGNOSIS — Z86010 Personal history of colonic polyps: Secondary | ICD-10-CM

## 2019-02-20 MED ORDER — NA SULFATE-K SULFATE-MG SULF 17.5-3.13-1.6 GM/177ML PO SOLN: 1.0000 | Freq: Once | ORAL | 0 refills | Status: AC

## 2019-02-20 MED FILL — SUPREP BOWEL PREP KIT: 17.5-3.13-1 | 1 days supply | Qty: 354 | Fill #0

## 2019-02-20 NOTE — Progress Notes (Signed)
No egg or soy allergy known to patient  No issues with past sedation with any surgeries  or procedures, no intubation problems  No diet pills per patient No home 02 use per patient  No blood thinners per patient  Pt denies issues with constipation  No A fib or A flutter  EMMI video sent to pt's e mail   Due to the COVID-19 pandemic we are asking patients to follow these guidelines. Please only bring one care partner. Please be aware that your care partner may wait in the car in the parking lot or if they feel like they will be too hot to wait in the car, they may wait in the lobby on the 4th floor. All care partners are required to wear a mask the entire time (we do not have any that we can provide them), they need to practice social distancing, and we will do a Covid check for all patient's and care partners when you arrive. Also we will check their temperature and your temperature. If the care partner waits in their car they need to stay in the parking lot the entire time and we will call them on their cell phone when the patient is ready for discharge so they can bring the car to the front of the building. Also all patient's will need to wear a mask into building.  suprep coupon provided

## 2019-02-26 ENCOUNTER — Telehealth: Payer: Self-pay

## 2019-02-26 NOTE — Telephone Encounter (Signed)
Covid-19 screening questions   Do you now or have you had a fever in the last 14 days? NO   Do you have any respiratory symptoms of shortness of breath or cough now or in the last 14 days? NO  Do you have any family members or close contacts with diagnosed or suspected Covid-19 in the past 14 days? NO  Have you been tested for Covid-19 and found to be positive? NO        

## 2019-02-27 ENCOUNTER — Ambulatory Visit (AMBULATORY_SURGERY_CENTER): Payer: 59 | Admitting: Internal Medicine

## 2019-02-27 ENCOUNTER — Encounter: Payer: Self-pay | Admitting: Internal Medicine

## 2019-02-27 ENCOUNTER — Encounter (HOSPITAL_COMMUNITY)
Admission: AD | Disposition: A | Discharge status: Home / Self Care | Payer: Self-pay | Source: Ambulatory Visit | Attending: Internal Medicine

## 2019-02-27 ENCOUNTER — Other Ambulatory Visit: Payer: Self-pay | Admitting: Internal Medicine

## 2019-02-27 ENCOUNTER — Other Ambulatory Visit: Payer: Self-pay

## 2019-02-27 ENCOUNTER — Ambulatory Visit (HOSPITAL_COMMUNITY)
Admission: AD | Admit: 2019-02-27 | Discharge: 2019-02-27 | Disposition: A | Discharge status: Home / Self Care | Payer: 59 | Source: Ambulatory Visit | Attending: Internal Medicine | Admitting: Internal Medicine

## 2019-02-27 ENCOUNTER — Telehealth: Payer: Self-pay | Admitting: Internal Medicine

## 2019-02-27 ENCOUNTER — Encounter (HOSPITAL_COMMUNITY): Payer: Self-pay | Admitting: Internal Medicine

## 2019-02-27 VITALS — BP 149/89 | HR 65 | Temp 98.0°F | Resp 14 | Ht 62.0 in | Wt 162.2 lb

## 2019-02-27 DIAGNOSIS — Z8719 Personal history of other diseases of the digestive system: Secondary | ICD-10-CM | POA: Diagnosis not present

## 2019-02-27 DIAGNOSIS — M19041 Primary osteoarthritis, right hand: Secondary | ICD-10-CM | POA: Insufficient documentation

## 2019-02-27 DIAGNOSIS — D125 Benign neoplasm of sigmoid colon: Secondary | ICD-10-CM

## 2019-02-27 DIAGNOSIS — I10 Essential (primary) hypertension: Secondary | ICD-10-CM | POA: Diagnosis not present

## 2019-02-27 DIAGNOSIS — K921 Melena: Secondary | ICD-10-CM | POA: Diagnosis not present

## 2019-02-27 DIAGNOSIS — Z9889 Other specified postprocedural states: Secondary | ICD-10-CM | POA: Diagnosis not present

## 2019-02-27 DIAGNOSIS — M19042 Primary osteoarthritis, left hand: Secondary | ICD-10-CM | POA: Diagnosis not present

## 2019-02-27 DIAGNOSIS — Z888 Allergy status to other drugs, medicaments and biological substances status: Secondary | ICD-10-CM | POA: Diagnosis not present

## 2019-02-27 DIAGNOSIS — K91840 Postprocedural hemorrhage and hematoma of a digestive system organ or structure following a digestive system procedure: Secondary | ICD-10-CM

## 2019-02-27 DIAGNOSIS — M166 Other bilateral secondary osteoarthritis of hip: Secondary | ICD-10-CM | POA: Insufficient documentation

## 2019-02-27 DIAGNOSIS — Z8249 Family history of ischemic heart disease and other diseases of the circulatory system: Secondary | ICD-10-CM | POA: Diagnosis not present

## 2019-02-27 DIAGNOSIS — Z1211 Encounter for screening for malignant neoplasm of colon: Secondary | ICD-10-CM | POA: Diagnosis not present

## 2019-02-27 DIAGNOSIS — Z86010 Personal history of colonic polyps: Secondary | ICD-10-CM | POA: Diagnosis not present

## 2019-02-27 DIAGNOSIS — K626 Ulcer of anus and rectum: Secondary | ICD-10-CM | POA: Diagnosis not present

## 2019-02-27 DIAGNOSIS — K5730 Diverticulosis of large intestine without perforation or abscess without bleeding: Secondary | ICD-10-CM | POA: Diagnosis not present

## 2019-02-27 HISTORY — DX: Postprocedural hemorrhage of a digestive system organ or structure following a digestive system procedure: K91.840

## 2019-02-27 HISTORY — PX: HEMOSTASIS CLIP PLACEMENT: SHX6857

## 2019-02-27 HISTORY — PX: FLEXIBLE SIGMOIDOSCOPY: SHX5431

## 2019-02-27 SURGERY — SIGMOIDOSCOPY, FLEXIBLE

## 2019-02-27 SURGERY — SIGMOIDOSCOPY, FLEXIBLE
Anesthesia: Moderate Sedation

## 2019-02-27 MED ORDER — SODIUM CHLORIDE 0.9 % IV SOLN: 500.0000 mL | INTRAVENOUS | Status: DC

## 2019-02-27 MED ORDER — SODIUM CHLORIDE 0.9 % IV SOLN: INTRAVENOUS | Status: DC

## 2019-02-27 NOTE — Progress Notes (Signed)
[name], Robbin checked in . [employer].  Pt's states no medical or surgical changes since previsit

## 2019-02-27 NOTE — Progress Notes (Addendum)
Patient had unsedated flexible sigmoidoscopy. Vitals obtained prior to procedure and after. Heart rate and rhythm monitored continuously throughout procedure.

## 2019-02-27 NOTE — Op Note (Signed)
[facility] Patient Name: Melanie Hart Procedure Date: 02/27/2019 12:24 PM MRN: [id number] Endoscopist: Jerene Bears , MD Age: 58 Referring MD:  Date of Birth: Apr 17, 1961 Gender: Female Account #: [id number] Procedure:                Colonoscopy Indications:              High risk colon cancer surveillance: Personal                            history of sessile serrated colon polyp (less than                            10 mm in size) with no dysplasia, Last colonoscopy                            5 years ago Medicines:                Monitored Anesthesia Care Procedure:                Pre-Anesthesia Assessment:                           - Prior to the procedure, a History and Physical                            was performed, and patient medications and                            allergies were reviewed. The patient's tolerance of                            previous anesthesia was also reviewed. The risks                            and benefits of the procedure and the sedation                            options and risks were discussed with the patient.                            All questions were answered, and informed consent                            was obtained. Prior Anticoagulants: The patient has                            taken no previous anticoagulant or antiplatelet                            agents. ASA Grade Assessment: II - A patient with                            mild systemic disease. After reviewing the risks  and benefits, the patient was deemed in                            satisfactory condition to undergo the procedure.                           After obtaining informed consent, the colonoscope                            was passed under direct vision. Throughout the                            procedure, the patient's blood pressure, pulse, and                            oxygen saturations were monitored continuously. The                           Colonoscope was introduced through the anus and                            advanced to the cecum, identified by appendiceal                            orifice and ileocecal valve. The colonoscopy was                            performed without difficulty. The patient tolerated                            the procedure well. The quality of the bowel                            preparation was good. The ileocecal valve,                            appendiceal orifice, and rectum were photographed. Scope In: 1:37:40 PM Scope Out: 1:54:54 PM Scope Withdrawal Time: 0 hours 12 minutes 51 seconds  Total Procedure Duration: 0 hours 17 minutes 14 seconds  Findings:                 The digital rectal exam was normal.                           A 8 mm polyp was found in the distal sigmoid colon.                            The polyp was semi-pedunculated. The polyp was                            removed with a cold snare. Resection and retrieval                            were complete.  Many small and large-mouthed diverticula were found                            in the sigmoid colon, descending colon and                            transverse colon.                           Internal hemorrhoids and hypertrophied anal                            papillae were found during retroflexion. Complications:            No immediate complications. Estimated Blood Loss:     Estimated blood loss was minimal. Impression:               - One 8 mm polyp in the distal sigmoid colon,                            removed with a cold snare. Resected and retrieved.                           - Diverticulosis in the sigmoid colon, in the                            descending colon and in the transverse colon.                           - Internal hemorrhoids. Recommendation:           - Patient has a contact number available for                            emergencies. The  signs and symptoms of potential                            delayed complications were discussed with the                            patient. Return to normal activities tomorrow.                            Written discharge instructions were provided to the                            patient.                           - Resume previous diet.                           - Continue present medications.                           - Await pathology results.                           -  Repeat colonoscopy is recommended for                            surveillance. The colonoscopy date will be                            determined after pathology results from today's                            exam become available for review. Jerene Bears, MD 02/27/2019 1:58:36 PM This report has been signed electronically.

## 2019-02-27 NOTE — Telephone Encounter (Signed)
5 episodes of rectal bleeding since removal of sigmoid polyp by cold snare today. Feels ok. Not dizzy, lightheaded.  She will come to ED and we will place a clip on the polyp site as I think that is the bleeding source

## 2019-02-27 NOTE — Discharge Instructions (Signed)

## 2019-02-27 NOTE — Patient Instructions (Signed)
YOU HAD AN ENDOSCOPIC PROCEDURE TODAY AT THE [facility]:   Refer to the procedure report that was given to you for any specific questions about what was found during the examination.  If the procedure report does not answer your questions, please call your gastroenterologist to clarify.  If you requested that your care partner not be given the details of your procedure findings, then the procedure report has been included in a sealed envelope for you to review at your convenience later.  YOU SHOULD EXPECT: Some feelings of bloating in the abdomen. Passage of more gas than usual.  Walking can help get rid of the air that was put into your GI tract during the procedure and reduce the bloating. If you had a lower endoscopy (such as a colonoscopy or flexible sigmoidoscopy) you may notice spotting of blood in your stool or on the toilet paper. If you underwent a bowel prep for your procedure, you may not have a normal bowel movement for a few days.  Please Note:  You might notice some irritation and congestion in your nose or some drainage.  This is from the oxygen used during your procedure.  There is no need for concern and it should clear up in a day or so.  SYMPTOMS TO REPORT IMMEDIATELY:   Following lower endoscopy (colonoscopy or flexible sigmoidoscopy):  Excessive amounts of blood in the stool  Significant tenderness or worsening of abdominal pains  Swelling of the abdomen that is new, acute  Fever of 100F or higher  For urgent or emergent issues, a gastroenterologist can be reached at any hour by calling [phone].   DIET:  We do recommend a small meal at first, but then you may proceed to your regular diet.  Drink plenty of fluids but you should avoid alcoholic beverages for 24 hours.  ACTIVITY:  You should plan to take it easy for the rest of today and you should NOT DRIVE or use heavy machinery until tomorrow (because of the sedation medicines used during the test).     FOLLOW UP: Our staff will call the number listed on your records 48-72 hours following your procedure to check on you and address any questions or concerns that you may have regarding the information given to you following your procedure. If we do not reach you, we will leave a message.  We will attempt to reach you two times.  During this call, we will ask if you have developed any symptoms of COVID 19. If you develop any symptoms (ie: fever, flu-like symptoms, shortness of breath, cough etc.) before then, please call [phone].  If you test positive for Covid 19 in the 2 weeks post procedure, please call and report this information to us.    If any biopsies were taken you will be contacted by phone or by letter within the next 1-3 weeks.  Please call us at [phone] if you have not heard about the biopsies in 3 weeks.    SIGNATURES/CONFIDENTIALITY: You and/or your care partner have signed paperwork which will be entered into your electronic medical record.  These signatures attest to the fact that that the information above on your After Visit Summary has been reviewed and is understood.  Full responsibility of the confidentiality of this discharge information lies with you and/or your care-partner. 

## 2019-02-27 NOTE — Progress Notes (Signed)
Report given to PACU, vss 

## 2019-02-27 NOTE — H&P (Signed)
[facility] History and Physical   Primary Care Physician:  Lavada Mesi   Reason for Procedure:   Lower GI bleeding after colonoscopy and polypectomy  Plan:    Sigmoidoscopy w/ control of bleeding     HPI: Melanie Hart is a 58 y.o. female s/p colonoscopy and cold snare polypectomy of 8 mm sigmoid colon polyp earlier today. She has had 5 episodes of rectal bleeding w/o other symptoms since then. No light-headedness or dizziness.   Past Medical History:  Diagnosis Date  . Allergy    seasonal  . Arthritis    hips,hands  . Hemorrhage of colon following colonoscopy 02/27/2019  . Hypertension    not currently on meds    Past Surgical History:  Procedure Laterality Date  . COLONOSCOPY    . INCONTINENCE SURGERY    . wisdom teeth      Prior to Admission medications   Medication Sig Start Date End Date Taking? Authorizing Provider  [place] [phone] MG TABS  07/26/16   [provider]  Coenzyme Q10 (CO Q-10) 400 MG CAPS  11/25/16   [provider]  loratadine (CLARITIN) 10 MG tablet Take 10 mg by mouth daily.    [provider]  Omega-3 Fatty Acids (FISH OIL PO) Take by mouth.    [provider]  Red Yeast Rice 600 MG CAPS Take by mouth.    [provider]  triamcinolone (NASACORT) 55 MCG/ACT AERO nasal inhaler Place 2 sprays into the nose daily.    [provider]    No current facility-administered medications for this encounter.     Allergies as of 02/27/2019 - Review Complete 02/27/2019  Allergen Reaction Noted  . Contrave [naltrexone-bupropion hcl er]  02/19/2014  . Lisinopril  12/02/2013    Family History  Problem Relation Age of Onset  . Hypertension Mother   . Colon polyps Mother 73       pre-cancer polyps  . Hypertension Sister   . Hyperlipidemia Brother   . Hypertension Brother   . Esophageal cancer Neg Hx   . Rectal cancer Neg Hx   . Stomach cancer Neg Hx   .  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  Physical Exam: Vital signs in last 24 hours: Temp:  [98 F (36.7 C)] 98 F (36.7 C) (10/02 1239) Pulse Rate:  [57-74] 65 (10/02 1420) Resp:  [12-23] 14 (10/02 1420) BP: (125-170)/(72-105) 149/89 (10/02 1420) SpO2:  [95 %-99 %] 99 % (10/02 1420) Weight:  [73.6 kg] 73.6 kg (10/02 1236)   General:   Alert,  Well-developed, well-nourished, pleasant and cooperative in NAD Lungs:  Clear throughout to auscultation.   Heart:  Regular rate and rhythm; no murmurs, clicks, rubs,  or gallops. Abdomen:  Soft, nontender and nondistended. Normal bowel sounds.   Neuro/Psych:  Alert and cooperative. Normal mood and affect. A and O x 3   @Carl  Simonne Maffucci, MD, [facility] [phone] (pager) 02/27/2019 8:13 PM@

## 2019-02-27 NOTE — Op Note (Signed)
[facility] Patient Name: Melanie Hart Procedure Date : 02/27/2019 MRN: [id number] Attending MD: Gatha Mayer , MD Date of Birth: 1961/05/16 CSN: [id number] Age: 58 Admit Type: Outpatient Procedure:                Flexible Sigmoidoscopy Indications:              Treatment of bleeding from polypectomy site - Had 8                            mm cold polypectomy earlier today - 6 episodes                            hematochezia since Providers:                Gatha Mayer, MD, Carlyn Reichert, RN, Josie Dixon, RN, Marguerita Merles, Technician, Lazaro Arms, Technician Referring MD:              Medicines:                None Complications:            No immediate complications. Estimated Blood Loss:     Estimated blood loss: none. Procedure:                Pre-Anesthesia Assessment:                           - Prior to the procedure, a History and Physical                            was performed, and patient medications and                            allergies were reviewed. The patient's tolerance of                            previous anesthesia was also reviewed. The risks                            and benefits of the procedure and the sedation                            options and risks were discussed with the patient.                            All questions were answered, and informed consent                            was obtained. Prior Anticoagulants: The patient has  taken no previous anticoagulant or antiplatelet                            agents. ASA Grade Assessment: II - A patient with                            mild systemic disease. After reviewing the risks                            and benefits, the patient was deemed in                            satisfactory condition to undergo the procedure.                           After obtaining informed consent, the scope was                             passed under direct vision. The [id number]                            [id number]) Olympus therapeutic gastroscope was                            introduced through the anus and advanced to the the                            sigmoid colon. The flexible sigmoidoscopy was                            accomplished without difficulty. The patient                            tolerated the procedure well. The quality of the                            bowel preparation was good. Scope In: Scope Out: Findings:      The perianal and digital rectal examinations were normal.      A single (solitary) ulcer was found in the recto-sigmoid colon.       Polypectomy site - with a non-bleeding visible vessel No bleeding was       present. Stigmata of recent bleeding were present. For hemostasis, two       hemostatic clips were successfully placed (MR conditional). There was no       bleeding during, or at the end, of the procedure. Estimated blood loss:       none.      Multiple diverticula were found in the sigmoid colon.      Red blood was found in the rectum and in the sigmoid colon. Scattedred       clots - suctioned Impression:               - A single (solitary) ulcer in the proximal rectum.  Polypectomy site with a non-bleeding visble vessel                            Clips (MR conditional) were placed.                           - Diverticulosis in the sigmoid colon.                           - Blood in the rectum and in the sigmoid colon.                            Scattered clots suctioned                           - No specimens collected. Recommendation:           - Discharge patient to home (ambulatory). Procedure Code(s):        --- Professional ---                           [phone], Sigmoidoscopy, flexible; with control of                            bleeding, any method Diagnosis Code(s):        --- Professional ---                            K62.6, Ulcer of anus and rectum                           K62.5, Hemorrhage of anus and rectum                           K92.2, Gastrointestinal hemorrhage, unspecified                           K91.840, Postprocedural hemorrhage of a digestive                            system organ or structure following a digestive                            system procedure                           K57.30, Diverticulosis of large intestine without                            perforation or abscess without bleeding CPT copyright 2019 American Medical Association. All rights reserved. The codes documented in this report are preliminary and upon coder review may  be revised to meet current compliance requirements. Gatha Mayer, MD 02/27/2019 8:59:58 PM This report has been signed electronically. Number of Addenda: 0

## 2019-03-02 ENCOUNTER — Encounter (HOSPITAL_COMMUNITY): Payer: Self-pay | Admitting: Internal Medicine

## 2019-03-02 ENCOUNTER — Telehealth: Payer: Self-pay | Admitting: Internal Medicine

## 2019-03-02 NOTE — Telephone Encounter (Signed)
I performed a colonoscopy for surveillance on Friday.  She then had hematochezia from the distal sigmoid/rectosigmoid polyp removed with cold snare.  She met Dr. Carlean Purl in the endoscopy lab and he performed a flexible sigmoidoscopy with endo-clipping of the polypectomy base.  She was discharged thereafter.  I called her today to check on her to ensure she has had no further bleeding. I reached her voicemail but left a message Pathology remains pending I advised her to let me know if she has any questions and certainly if she has further bleeding

## 2019-03-03 ENCOUNTER — Telehealth: Payer: Self-pay

## 2019-03-03 NOTE — Telephone Encounter (Signed)
  Follow up Call-  Call back number 02/27/2019  Post procedure Call Back phone  # [phone]  Permission to leave phone message Yes  Some recent data might be hidden     Patient questions:  Do you have a fever, pain , or abdominal swelling? No. Pain Score  0 *  Have you tolerated food without any problems? Yes.    Have you been able to return to your normal activities? Yes.    Do you have any questions about your discharge instructions: Diet   No. Medications  No. Follow up visit  No.  Do you have questions or concerns about your Care? No.  Actions: * If pain score is 4 or above: No action needed, pain <4.  Pt states she had to return to the hospital due to bleeding and had an emergency procedure to stop bleeding, states she has had no bleeding since then and is doing well.  1. Have you developed a fever since your procedure? no  2.   Have you had an respiratory symptoms (SOB or cough) since your procedure? no  3.   Have you tested positive for COVID 19 since your procedure no  4.   Have you had any family members/close contacts diagnosed with the COVID 19 since your procedure?  no   If yes to any of these questions please route to Joylene John, RN and Alphonsa Gin, Therapist, sports.

## 2019-03-09 ENCOUNTER — Encounter: Payer: Self-pay | Admitting: Physician Assistant

## 2019-04-30 ENCOUNTER — Encounter (HOSPITAL_COMMUNITY): Payer: Self-pay | Admitting: Internal Medicine

## 2019-09-21 ENCOUNTER — Other Ambulatory Visit: Payer: Self-pay | Admitting: Nurse Practitioner

## 2019-09-21 ENCOUNTER — Telehealth: Payer: Self-pay | Admitting: Physician Assistant

## 2019-09-21 DIAGNOSIS — Z131 Encounter for screening for diabetes mellitus: Secondary | ICD-10-CM

## 2019-09-21 DIAGNOSIS — Z13220 Encounter for screening for lipoid disorders: Secondary | ICD-10-CM

## 2019-09-21 DIAGNOSIS — Z1329 Encounter for screening for other suspected endocrine disorder: Secondary | ICD-10-CM

## 2019-09-21 DIAGNOSIS — Z0000 Encounter for general adult medical examination without abnormal findings: Secondary | ICD-10-CM

## 2019-09-21 DIAGNOSIS — I10 Essential (primary) hypertension: Secondary | ICD-10-CM

## 2019-09-21 NOTE — Addendum Note (Signed)
Addended byAnnamaria Helling on: 09/21/2019 03:19 PM   Modules accepted: Orders

## 2019-09-21 NOTE — Telephone Encounter (Signed)
Melanie Hart would like Melanie Hart to order the labs at their old office.

## 2019-09-21 NOTE — Telephone Encounter (Signed)
CBC, CMP, Lipid, and TSH have been ordered for any [facility] laboratory.

## 2019-09-21 NOTE — Telephone Encounter (Signed)
Labs ordered. Mychart message sent to patient.

## 2019-09-21 NOTE — Telephone Encounter (Signed)
Patient would like labs ordered for physical in may. Would also like the [facility] to [facility] in [facility].

## 2019-09-25 ENCOUNTER — Encounter: Payer: Self-pay | Admitting: Sports Medicine

## 2019-09-25 ENCOUNTER — Other Ambulatory Visit: Payer: Self-pay

## 2019-09-25 ENCOUNTER — Ambulatory Visit (INDEPENDENT_AMBULATORY_CARE_PROVIDER_SITE_OTHER): Payer: 59 | Admitting: Sports Medicine

## 2019-09-25 DIAGNOSIS — R519 Headache, unspecified: Secondary | ICD-10-CM

## 2019-09-25 DIAGNOSIS — R030 Elevated blood-pressure reading, without diagnosis of hypertension: Secondary | ICD-10-CM | POA: Diagnosis not present

## 2019-09-25 DIAGNOSIS — R011 Cardiac murmur, unspecified: Secondary | ICD-10-CM | POA: Diagnosis not present

## 2019-09-25 MED ORDER — TRAMADOL HCL 50 MG PO TABS: 50.0000 mg | ORAL_TABLET | Freq: Three times a day (TID) | ORAL | 0 refills | Status: DC | PRN

## 2019-09-25 MED FILL — traMADol HCL 50 MG TABS: 50 | 4 days supply | Qty: 21 | Fill #0

## 2019-09-25 NOTE — Assessment & Plan Note (Signed)
Melanie Hart has noted some elevated blood pressures but also dull headaches. Denies any chest pain, visual changes, but she did endorse a severe thunderclap type headache recently. Her blood pressure is adequate today, I have recommended low carb diet for weight loss, low-sodium diet, aerobic exercise according to the [employer] sports medicine recommendations. I like to see her back in about 2 weeks. She is going to beating her labs fairly soon.

## 2019-09-25 NOTE — Assessment & Plan Note (Signed)
I think that Melanie Hart's elevated blood pressure is likely secondary to her headache. She did endorse a thunderclap type posterior headache, it is now dull. We are certainly going to proceed with a brain MRI with and without contrast as well as brain MRI. I would like her to go and get a BMP today.

## 2019-09-25 NOTE — Assessment & Plan Note (Signed)
New onset systolic murmur not documented on prior exam with PCP. Suspect aortic stenosis, adding 2D echocardiogram.

## 2019-09-25 NOTE — Addendum Note (Signed)
Addended by: Silverio Decamp on: 09/25/2019 01:43 PM   Modules accepted: Orders

## 2019-09-25 NOTE — Progress Notes (Signed)
    Procedures performed today:    None.  Independent interpretation of notes and tests performed by another provider:   None.  Brief History, Exam, Impression, and Recommendations:    Elevated blood pressure reading Betta has noted some elevated blood pressures but also dull headaches. Denies any chest pain, visual changes, but she did endorse a severe thunderclap type headache recently. Her blood pressure is adequate today, I have recommended low carb diet for weight loss, low-sodium diet, aerobic exercise according to the [employer] sports medicine recommendations. I like to see her back in about 2 weeks. She is going to beating her labs fairly soon.  Headache I think that Elveria's elevated blood pressure is likely secondary to her headache. She did endorse a thunderclap type posterior headache, it is now dull. We are certainly going to proceed with a brain MRI with and without contrast as well as brain MRI. I would like her to go and get a BMP today.  Systolic murmur New onset systolic murmur not documented on prior exam with PCP. Suspect aortic stenosis, adding 2D echocardiogram.    ___________________________________________ Gwen Her. Dianah Field, M.D., ABFM., CAQSM. Primary Care and [place] Instructor of [place] of [facility] of Medicine

## 2019-09-26 LAB — BASIC METABOLIC PANEL WITH GFR
BUN: 16 mg/dL (ref 7–25)
CO2: 25 mmol/L (ref 20–32)
Calcium: 10.1 mg/dL (ref 8.6–10.4)
Chloride: 106 mmol/L (ref 98–110)
Creat: 0.7 mg/dL (ref 0.50–1.05)
GFR, Est African American: 112 mL/min/{1.73_m2} (ref >=60)
GFR, Est Non African American: 97 mL/min/{1.73_m2} (ref >=60)
Glucose, Bld: 91 mg/dL (ref 65–139)
Potassium: 4.3 mmol/L (ref 3.5–5.3)
Sodium: 141 mmol/L (ref 135–146)

## 2019-10-05 ENCOUNTER — Ambulatory Visit (HOSPITAL_BASED_OUTPATIENT_CLINIC_OR_DEPARTMENT_OTHER): Payer: 59

## 2019-10-05 ENCOUNTER — Other Ambulatory Visit: Payer: Self-pay

## 2019-10-05 ENCOUNTER — Ambulatory Visit (INDEPENDENT_AMBULATORY_CARE_PROVIDER_SITE_OTHER): Payer: 59

## 2019-10-05 DIAGNOSIS — R519 Headache, unspecified: Secondary | ICD-10-CM | POA: Diagnosis not present

## 2019-10-05 IMAGING — MR MR HEAD WO/W CM
13 series · 48 of 48 positions shown · IV contrast (gadavist)
Comparison: None.

CLINICAL DATA: Headache, acute, normal neuro exam. Thunderclap type
headache. Headache is posterior.

EXAM:
MRI HEAD WITHOUT AND WITH CONTRAST
MRA HEAD WITHOUT CONTRAST
TECHNIQUE: Multiplanar, multiecho pulse sequences of the brain and surrounding
structures were obtained without and with intravenous contrast.
Angiographic images of the head were obtained using MRA technique
without contrast.
CONTRAST:  7.5mL GADAVIST GADOBUTROL 1 MMOL/ML IV SOLN

[Series 2: DWI · axial · 3.0mm · 1.20mm/px · z∈[-55,+91]mm · 4 of 53 slices shown (1 of 4)]
[im 1/53]
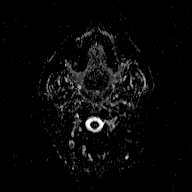
[im 18/53]
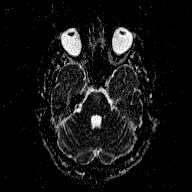
[im 35/53]
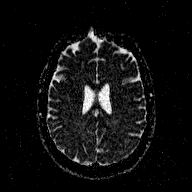
[im 53/53]
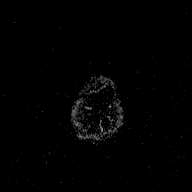

[Series 4: DWI · coronal · 3.0mm · 1.15mm/px · 3 of 45 slices shown (2 of 4)]
[im 1/45]
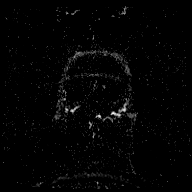
[im 23/45]
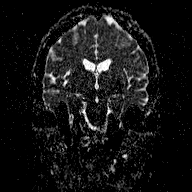
[im 45/45]
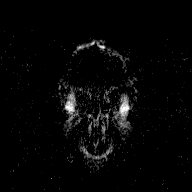

[Series 5: T1 · sagittal · 5.0mm · 0.45mm/px · 1 of 23 slices shown (1 of 2)]
[im 1/23]
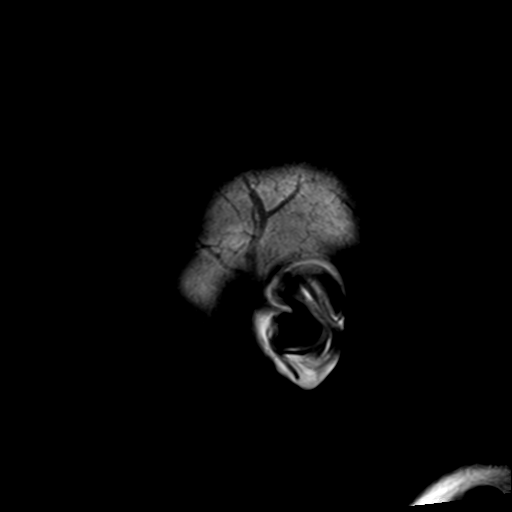

[Series 6: T2 · axial · 3.0mm · 0.72mm/px · z∈[-54,+91]mm · 3 of 53 slices shown (1 of 2)]
[im 1/53]
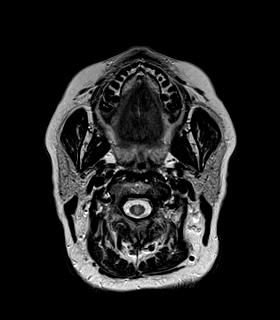
[im 27/53]
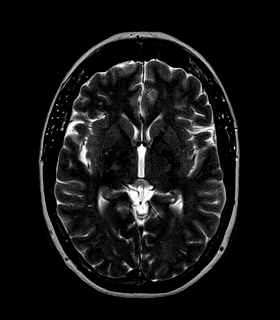
[im 53/53]
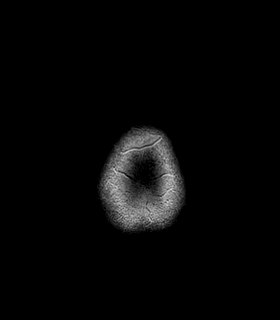

[Series 7: FLAIR · axial · 3.0mm · 0.45mm/px · z∈[-57,+94]mm · 3 of 55 slices shown (1 of 2)]
[im 1/55]
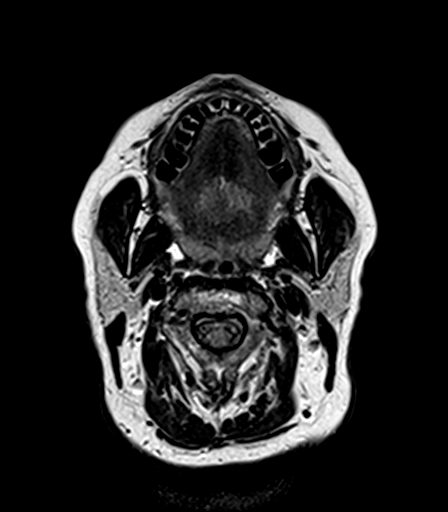
[im 28/55]
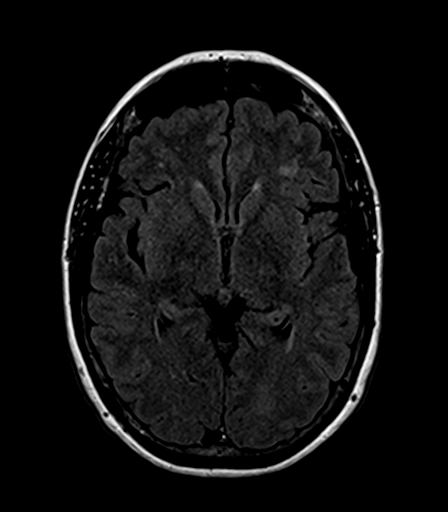
[im 55/55]
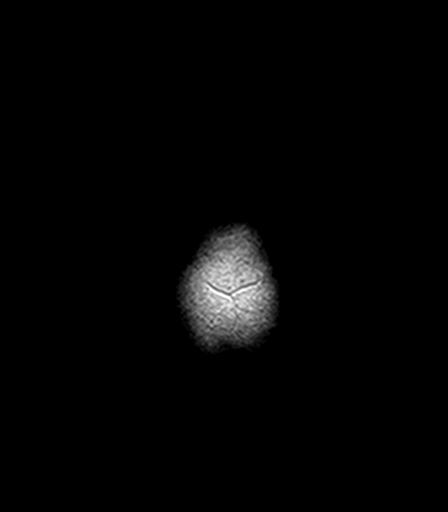

[Series 8: T2 · axial · 3.0mm · 0.72mm/px · z∈[-54,+91]mm · 3 of 53 slices shown (2 of 2)]
[im 1/53]
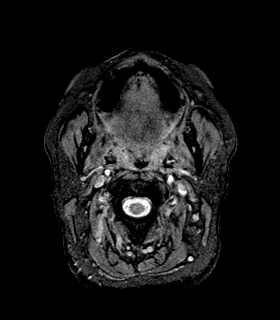
[im 27/53]
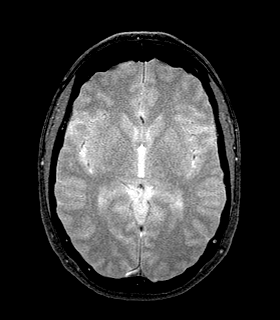
[im 53/53]
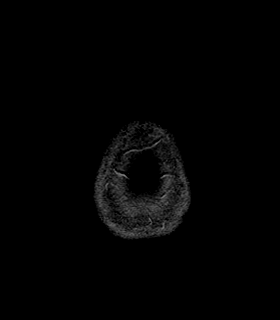

[Series 9: T1 · axial · 1.0mm · 1.00mm/px · z∈[-51,+97]mm · 10 of 160 slices shown (2 of 2)]
[im 1/160]
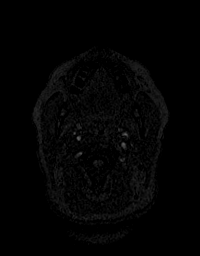
[im 18/160]
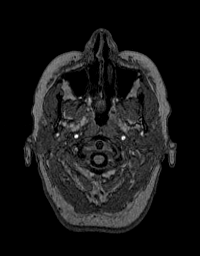
[im 36/160]
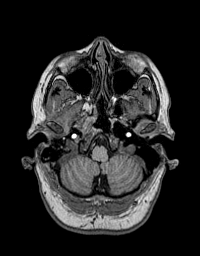
[im 54/160]
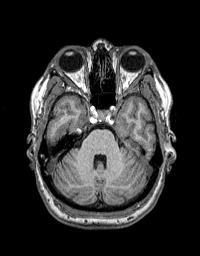
[im 71/160]
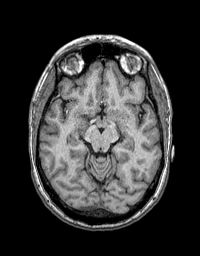
[im 89/160]
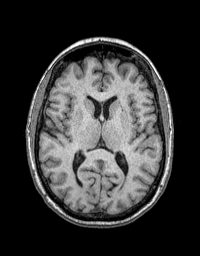
[im 107/160]
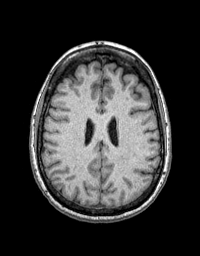
[im 124/160]
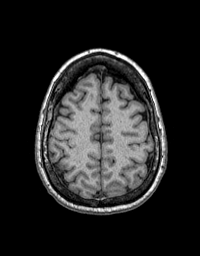
[im 142/160]
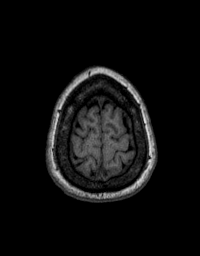
[im 160/160]
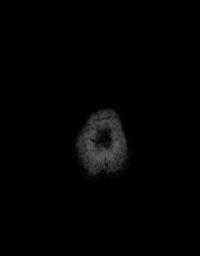

[Series 10: FLAIR · sagittal · 5.0mm · 0.45mm/px · 1 of 23 slices shown (2 of 2)]
[im 1/23]
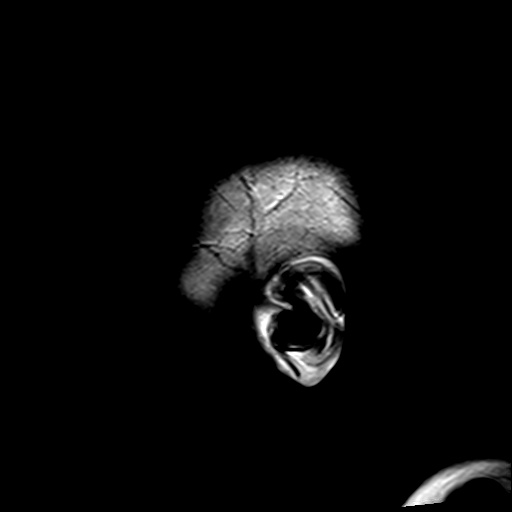

[Series 11: T2 post-contrast · coronal · 5.0mm · 0.43mm/px · 2 of 31 slices shown]
[im 1/31]
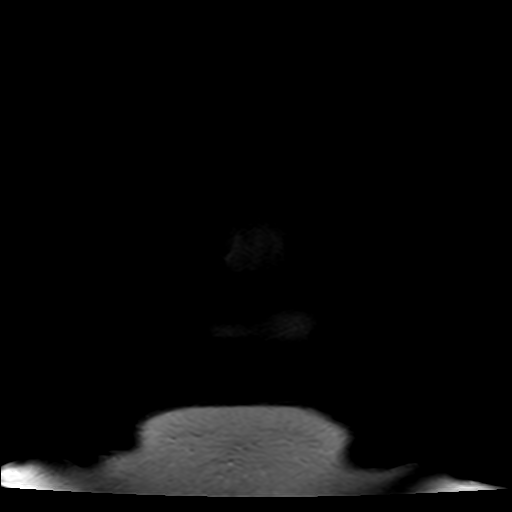
[im 31/31]
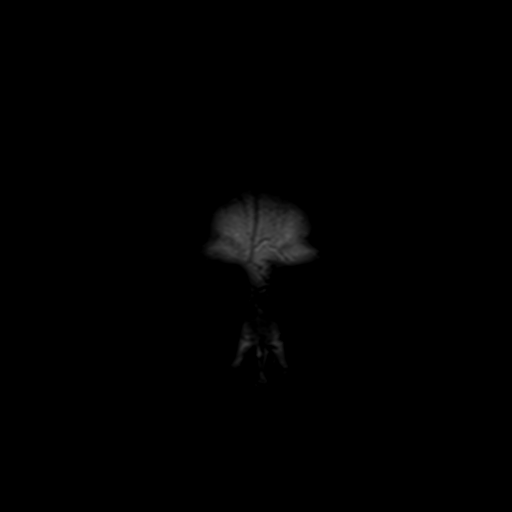

[Series 12: T1 post-contrast · axial · 1.0mm · 1.00mm/px · z∈[-51,+97]mm · 10 of 160 slices shown (1 of 2)]
[im 1/160]
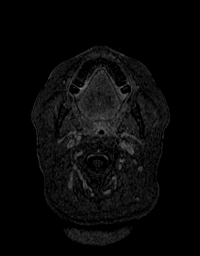
[im 18/160]
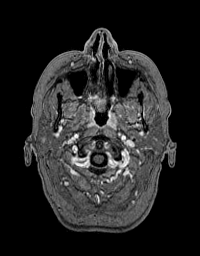
[im 36/160]
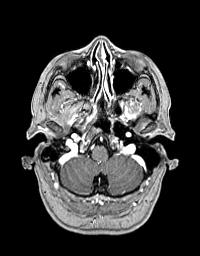
[im 54/160]
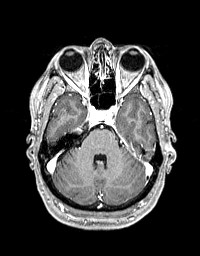
[im 71/160]
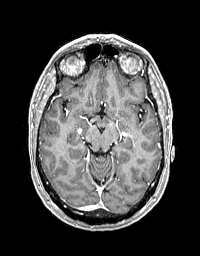
[im 89/160]
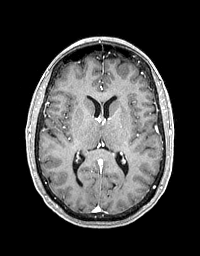
[im 107/160]
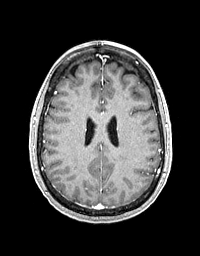
[im 124/160]
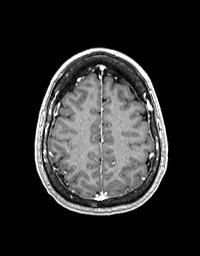
[im 142/160]
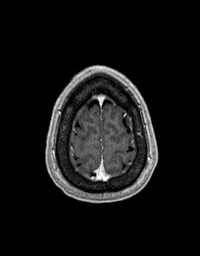
[im 160/160]
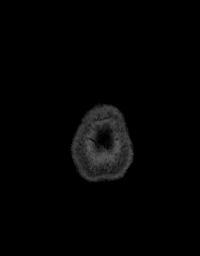

[Series 13: T1 post-contrast · coronal · 5.0mm · 0.43mm/px · 2 of 31 slices shown (2 of 2)]
[im 1/31]
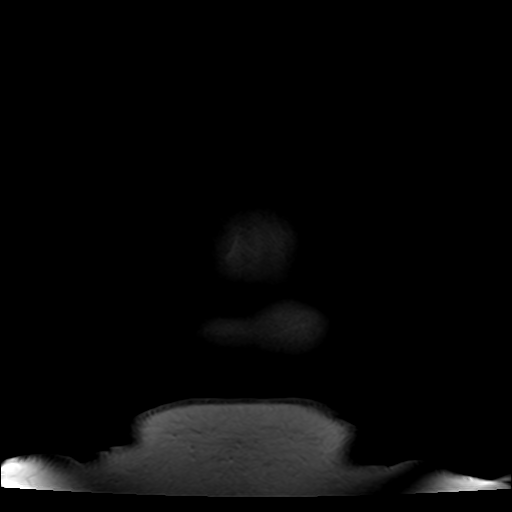
[im 31/31]
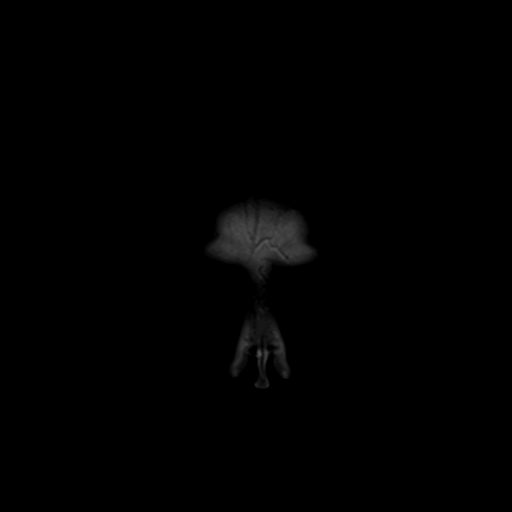

[Series 100: DWI · axial · 3.0mm · 1.20mm/px · z∈[-52,+91]mm · 3 of 52 slices shown (3 of 4)]
[im 1/52]
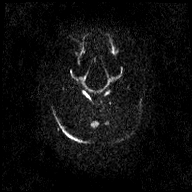
[im 26/52]
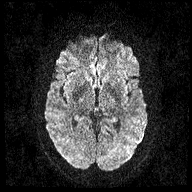
[im 52/52]
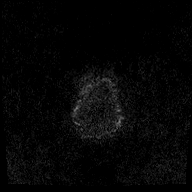

[Series 101: DWI · coronal · 3.0mm · 1.15mm/px · 3 of 42 slices shown (4 of 4)]
[im 1/42]
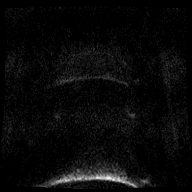
[im 21/42]
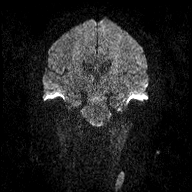
[im 42/42]
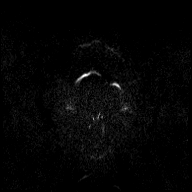

[48 of 48 positions shown; findings below may reference images not displayed]

FINDINGS: MRI HEAD FINDINGS

Brain: No acute infarct, hemorrhage, or mass lesion is present.
Scattered subcortical T2 hyperintensities bilaterally are moderately
advanced for age. No significant atrophy is present. Mild white
matter changes are noted in the pons. Brainstem and cerebellum are
otherwise normal. The ventricles are of normal size. No significant
extraaxial fluid collection is present.

Postcontrast images demonstrate no pathologic enhancement.

Vascular: Flow is present in the major intracranial arteries.

Skull and upper cervical spine: The craniocervical junction is
normal. Upper cervical spine is within normal limits. Marrow signal
is unremarkable.

Sinuses/Orbits: The paranasal sinuses and mastoid air cells are
clear. The globes and orbits are within normal limits.

MRA HEAD FINDINGS

The internal carotid arteries are within normal limits from the high
cervical segments through the ICA termini bilaterally. The A1 and M1
segments are normal. MCA bifurcations are intact. ACA and MCA branch
vessels are normal. No aneurysm is present.

The left vertebral artery is the dominant vessel. PICA origins are
visualized and normal bilaterally. Both posterior cerebral arteries
originate from basilar tip. Prominent right posterior communicating
artery contributes. PCA branch vessels are within normal limits
bilaterally.
IMPRESSION: 1. No acute intracranial abnormality.
2. Normal variant MRA Circle of Willis without significant proximal
stenosis, aneurysm, or branch vessel occlusion.
3. Scattered subcortical T2 hyperintensities bilaterally are
moderately advanced for age. The finding is nonspecific but can be
seen in the setting of chronic microvascular ischemia, a
demyelinating process such as multiple sclerosis, vasculitis,
complicated migraine headaches, or as the sequelae of a prior
infectious or inflammatory process.

## 2019-10-05 IMAGING — MR MR MRA HEAD W/O CM
1 series · 18 of 48 positions shown · IV contrast (gadavist)
Comparison: None.

CLINICAL DATA: Headache, acute, normal neuro exam. Thunderclap type
headache. Headache is posterior.

EXAM:
MRI HEAD WITHOUT AND WITH CONTRAST
MRA HEAD WITHOUT CONTRAST
TECHNIQUE: Multiplanar, multiecho pulse sequences of the brain and surrounding
structures were obtained without and with intravenous contrast.
Angiographic images of the head were obtained using MRA technique
without contrast.
CONTRAST:  7.5mL GADAVIST GADOBUTROL 1 MMOL/ML IV SOLN

[Series 3: TOF · oblique · non-contrast · 0.5mm · 0.35mm/px · 18 of 184 slices shown]
[im 1/184]
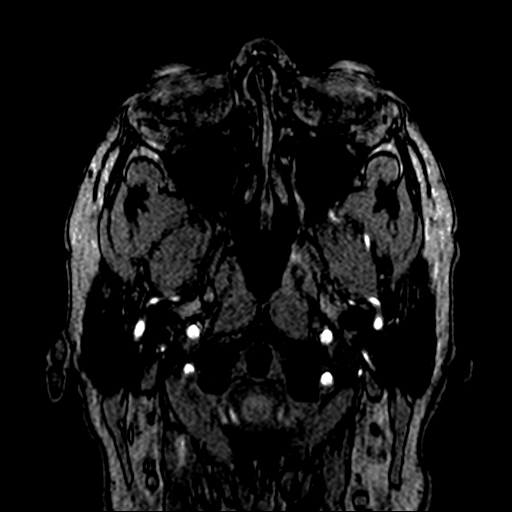
[im 4/184]
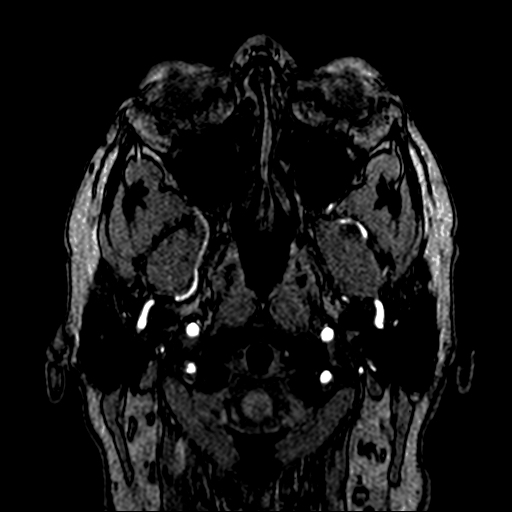
[im 8/184]
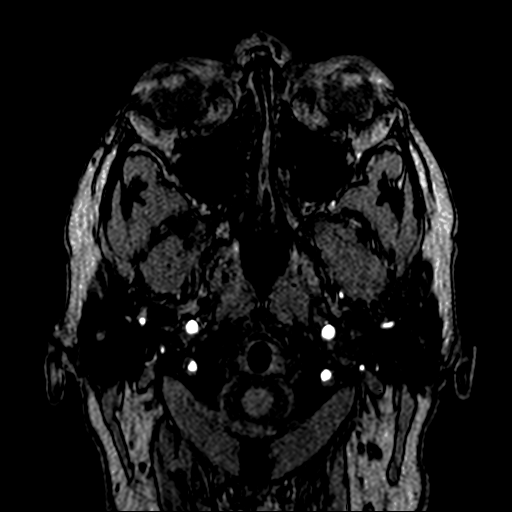
[im 12/184]
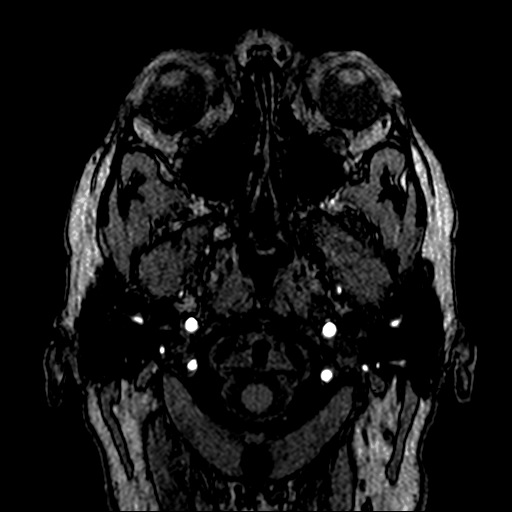
[im 16/184]
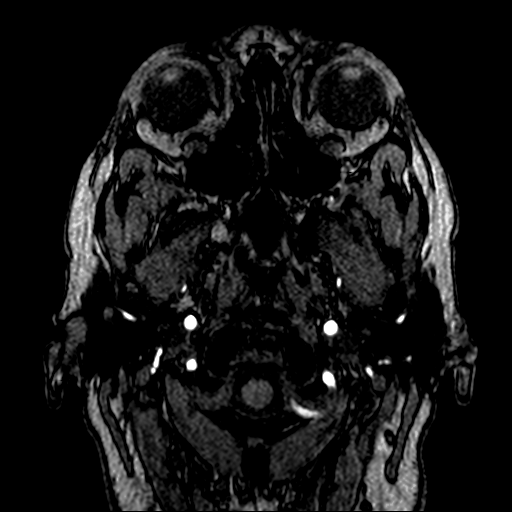
[im 20/184]
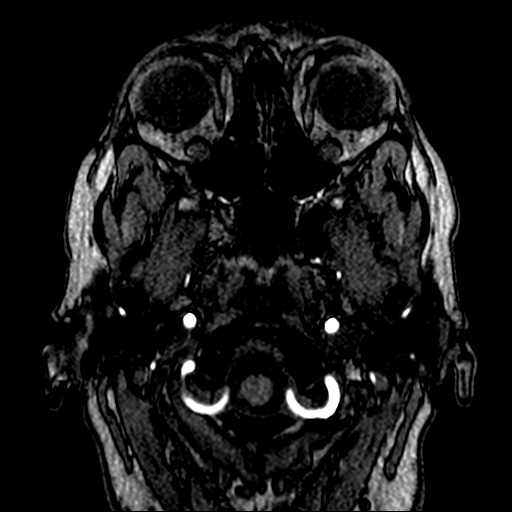
[im 24/184]
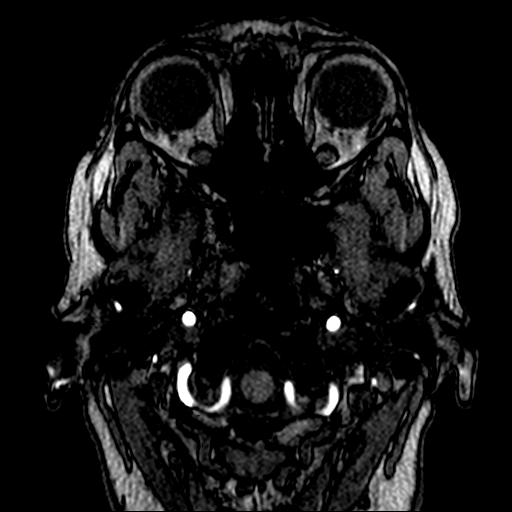
[im 28/184]
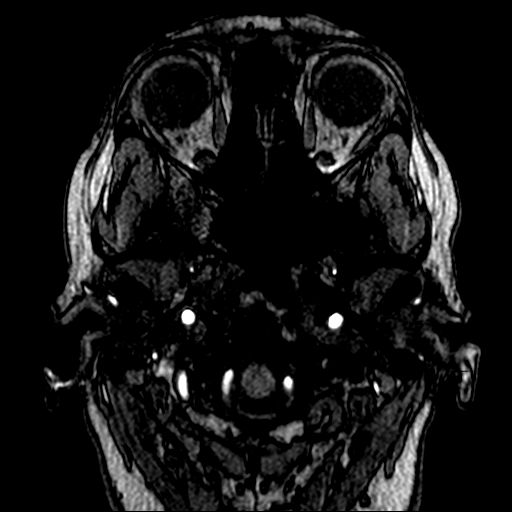
[im 32/184]
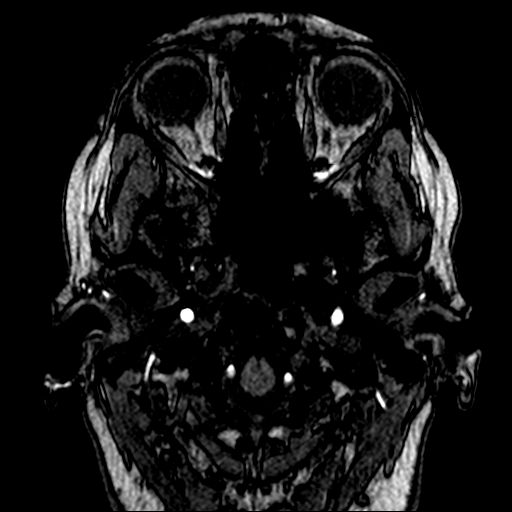
[im 36/184]
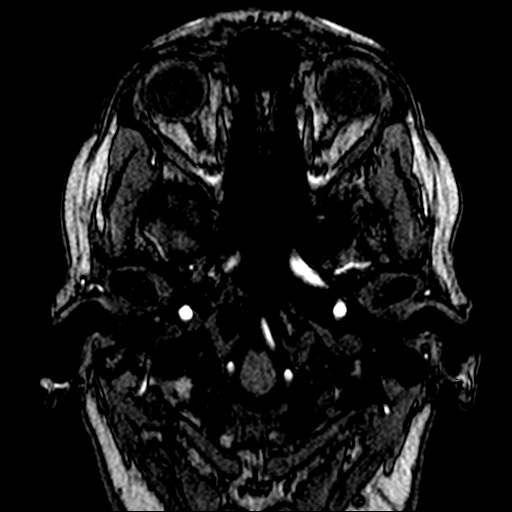
[im 59/184]
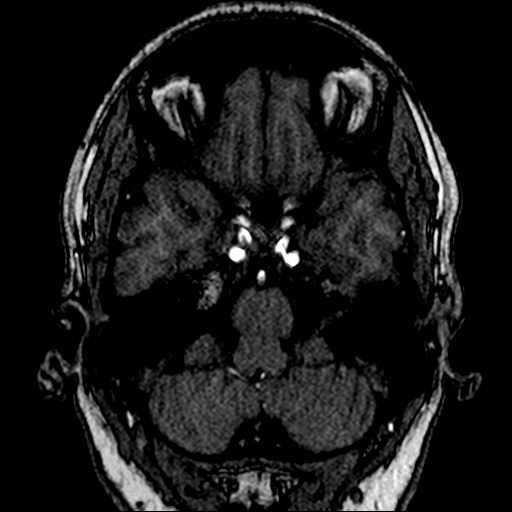
[im 82/184]
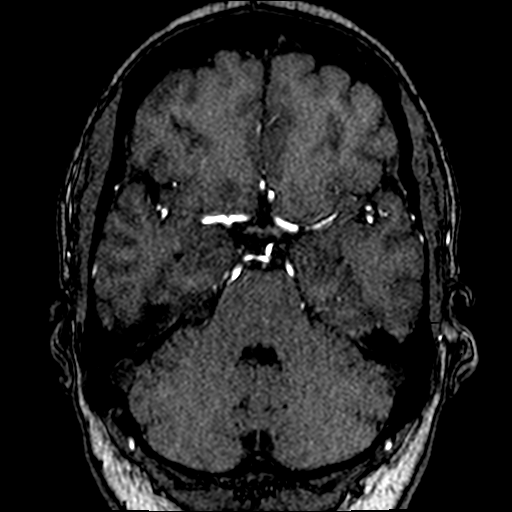
[im 94/184]
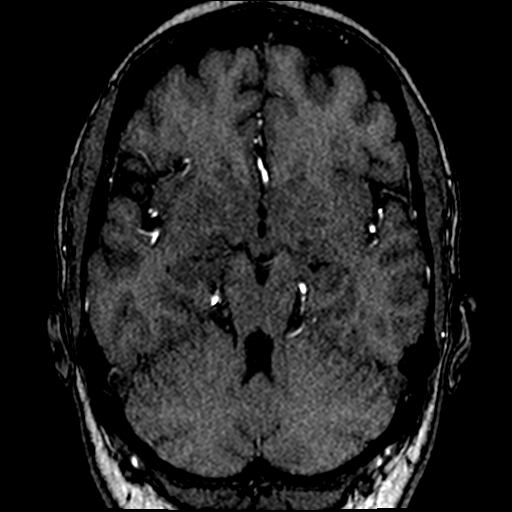
[im 106/184]
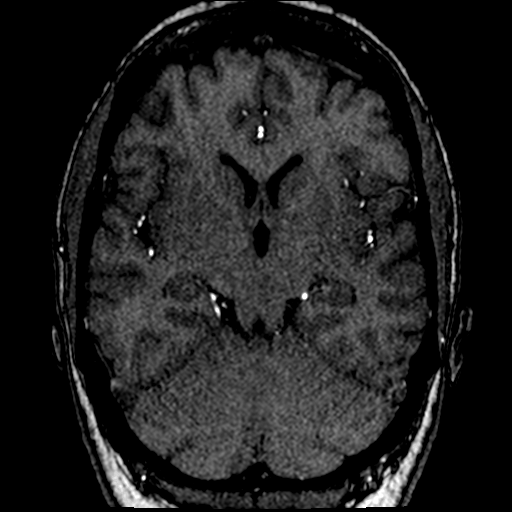
[im 129/184]
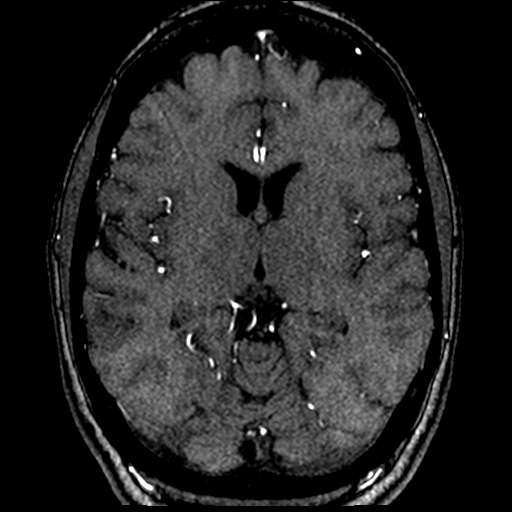
[im 152/184]
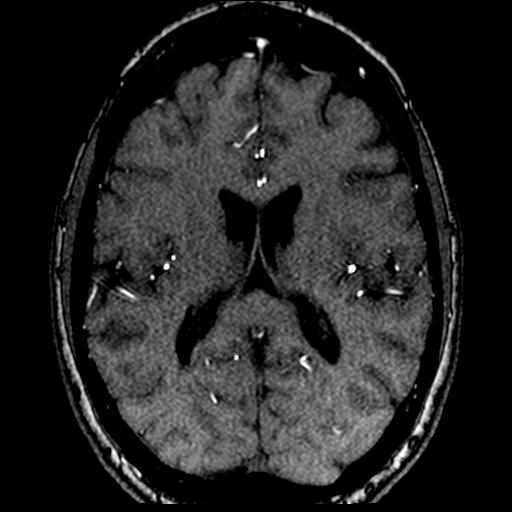
[im 156/184]
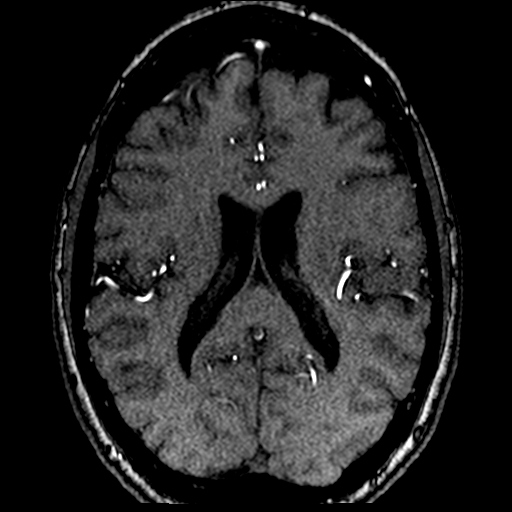
[im 176/184]
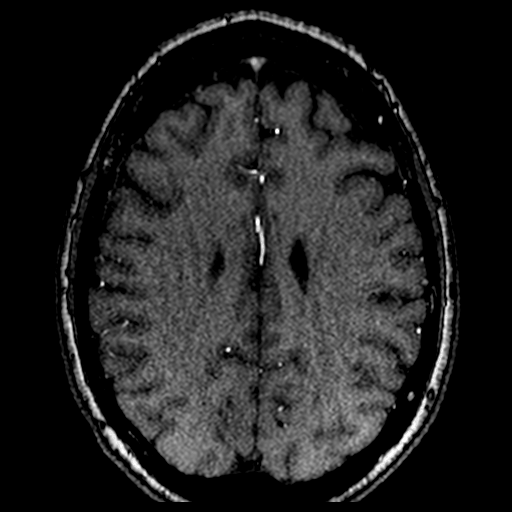

[18 of 48 positions shown; findings below may reference images not displayed]

FINDINGS: MRI HEAD FINDINGS

Brain: No acute infarct, hemorrhage, or mass lesion is present.
Scattered subcortical T2 hyperintensities bilaterally are moderately
advanced for age. No significant atrophy is present. Mild white
matter changes are noted in the pons. Brainstem and cerebellum are
otherwise normal. The ventricles are of normal size. No significant
extraaxial fluid collection is present.

Postcontrast images demonstrate no pathologic enhancement.

Vascular: Flow is present in the major intracranial arteries.

Skull and upper cervical spine: The craniocervical junction is
normal. Upper cervical spine is within normal limits. Marrow signal
is unremarkable.

Sinuses/Orbits: The paranasal sinuses and mastoid air cells are
clear. The globes and orbits are within normal limits.

MRA HEAD FINDINGS

The internal carotid arteries are within normal limits from the high
cervical segments through the ICA termini bilaterally. The A1 and M1
segments are normal. MCA bifurcations are intact. ACA and MCA branch
vessels are normal. No aneurysm is present.

The left vertebral artery is the dominant vessel. PICA origins are
visualized and normal bilaterally. Both posterior cerebral arteries
originate from basilar tip. Prominent right posterior communicating
artery contributes. PCA branch vessels are within normal limits
bilaterally.
IMPRESSION: 1. No acute intracranial abnormality.
2. Normal variant MRA Circle of Willis without significant proximal
stenosis, aneurysm, or branch vessel occlusion.
3. Scattered subcortical T2 hyperintensities bilaterally are
moderately advanced for age. The finding is nonspecific but can be
seen in the setting of chronic microvascular ischemia, a
demyelinating process such as multiple sclerosis, vasculitis,
complicated migraine headaches, or as the sequelae of a prior
infectious or inflammatory process.

## 2019-10-05 MED ORDER — GADOBUTROL 1 MMOL/ML IV SOLN
7.0000 mL | Freq: Once | INTRAVENOUS | Status: AC | PRN
  Administered 2019-10-05: 7.5 mL via INTRAVENOUS

## 2019-10-06 ENCOUNTER — Ambulatory Visit (HOSPITAL_BASED_OUTPATIENT_CLINIC_OR_DEPARTMENT_OTHER)
Admission: RE | Admit: 2019-10-06 | Discharge: 2019-10-06 | Disposition: A | Discharge status: Home / Self Care | Payer: 59 | Source: Ambulatory Visit | Attending: Sports Medicine | Admitting: Sports Medicine

## 2019-10-06 DIAGNOSIS — R011 Cardiac murmur, unspecified: Secondary | ICD-10-CM | POA: Insufficient documentation

## 2019-10-06 NOTE — Progress Notes (Signed)
  Echocardiogram 2D Echocardiogram has been performed.  Melanie Hart 10/06/2019, 10:54 AM

## 2019-10-09 ENCOUNTER — Other Ambulatory Visit: Payer: Self-pay

## 2019-10-09 ENCOUNTER — Encounter: Payer: Self-pay | Admitting: Sports Medicine

## 2019-10-09 ENCOUNTER — Ambulatory Visit: Payer: 59 | Admitting: Sports Medicine

## 2019-10-09 ENCOUNTER — Encounter: Payer: Self-pay | Admitting: Physician Assistant

## 2019-10-09 ENCOUNTER — Other Ambulatory Visit: Payer: 59

## 2019-10-09 DIAGNOSIS — R519 Headache, unspecified: Secondary | ICD-10-CM | POA: Diagnosis not present

## 2019-10-09 DIAGNOSIS — I10 Essential (primary) hypertension: Secondary | ICD-10-CM | POA: Diagnosis not present

## 2019-10-09 DIAGNOSIS — R011 Cardiac murmur, unspecified: Secondary | ICD-10-CM

## 2019-10-09 MED ORDER — FEXOFENADINE HCL 180 MG PO TABS: 180.0000 mg | ORAL_TABLET | Freq: Every day | ORAL | 3 refills | Status: DC

## 2019-10-09 MED ORDER — LOSARTAN POTASSIUM 50 MG PO TABS: 50.0000 mg | ORAL_TABLET | Freq: Every day | ORAL | 3 refills | Status: DC

## 2019-10-09 MED ORDER — PREDNISONE 50 MG PO TABS: ORAL_TABLET | ORAL | 0 refills | Status: DC

## 2019-10-09 NOTE — Progress Notes (Signed)
    Procedures performed today:    None.  Independent interpretation of notes and tests performed by another provider:   None.  Brief History, Exam, Impression, and Recommendations:    Headache Melanie Hart returns, she has been having headaches, we initially suspected thunderclap headache and obtained a brain MRI/MRA. This showed findings consistent with migraines but no vascular abnormalities. We will start with prednisone, Allegra. If this does not help we will follow more of a migraine pathway, she does tell me she did not tolerate Topamax.  Systolic murmur No valvular disease on echo.  Benign essential hypertension Blood pressure continues to be elevated, she is she has having some headaches but she did have a negative brain MRI. Restarting losartan. She will let us know how the blood pressures are running in a couple of weeks, [name] can follow this up afterwards.    ___________________________________________ Gwen Her. Dianah Field, M.D., ABFM., CAQSM. Primary Care and [place] Instructor of [place] of [facility] of Medicine

## 2019-10-09 NOTE — Assessment & Plan Note (Signed)
No valvular disease on echo.

## 2019-10-09 NOTE — Assessment & Plan Note (Signed)
Dorinda returns, she has been having headaches, we initially suspected thunderclap headache and obtained a brain MRI/MRA. This showed findings consistent with migraines but no vascular abnormalities. We will start with prednisone, Allegra. If this does not help we will follow more of a migraine pathway, she does tell me she did not tolerate Topamax.

## 2019-10-09 NOTE — Assessment & Plan Note (Signed)
Blood pressure continues to be elevated, she is she has having some headaches but she did have a negative brain MRI. Restarting losartan. She will let us know how the blood pressures are running in a couple of weeks, [name] can follow this up afterwards.

## 2019-10-12 ENCOUNTER — Other Ambulatory Visit: Payer: Self-pay

## 2019-10-12 DIAGNOSIS — R200 Anesthesia of skin: Secondary | ICD-10-CM | POA: Diagnosis not present

## 2019-10-12 DIAGNOSIS — Z131 Encounter for screening for diabetes mellitus: Secondary | ICD-10-CM

## 2019-10-12 DIAGNOSIS — Z1329 Encounter for screening for other suspected endocrine disorder: Secondary | ICD-10-CM

## 2019-10-12 DIAGNOSIS — I10 Essential (primary) hypertension: Secondary | ICD-10-CM

## 2019-10-12 DIAGNOSIS — Z13220 Encounter for screening for lipoid disorders: Secondary | ICD-10-CM

## 2019-10-12 DIAGNOSIS — H532 Diplopia: Secondary | ICD-10-CM | POA: Diagnosis not present

## 2019-10-12 DIAGNOSIS — R930 Abnormal findings on diagnostic imaging of skull and head, not elsewhere classified: Secondary | ICD-10-CM | POA: Diagnosis not present

## 2019-10-20 ENCOUNTER — Encounter: Payer: Self-pay | Admitting: Physician Assistant

## 2019-10-20 ENCOUNTER — Ambulatory Visit (INDEPENDENT_AMBULATORY_CARE_PROVIDER_SITE_OTHER): Payer: 59 | Admitting: Physician Assistant

## 2019-10-20 VITALS — BP 154/86 | HR 67 | Ht 62.0 in | Wt 155.0 lb

## 2019-10-20 DIAGNOSIS — R519 Headache, unspecified: Secondary | ICD-10-CM | POA: Diagnosis not present

## 2019-10-20 DIAGNOSIS — Z0000 Encounter for general adult medical examination without abnormal findings: Secondary | ICD-10-CM | POA: Diagnosis not present

## 2019-10-20 DIAGNOSIS — R202 Paresthesia of skin: Secondary | ICD-10-CM

## 2019-10-20 DIAGNOSIS — H532 Diplopia: Secondary | ICD-10-CM

## 2019-10-20 DIAGNOSIS — Z1231 Encounter for screening mammogram for malignant neoplasm of breast: Secondary | ICD-10-CM

## 2019-10-20 DIAGNOSIS — H6983 Other specified disorders of Eustachian tube, bilateral: Secondary | ICD-10-CM | POA: Diagnosis not present

## 2019-10-20 DIAGNOSIS — R930 Abnormal findings on diagnostic imaging of skull and head, not elsewhere classified: Secondary | ICD-10-CM | POA: Insufficient documentation

## 2019-10-20 DIAGNOSIS — I10 Essential (primary) hypertension: Secondary | ICD-10-CM | POA: Diagnosis not present

## 2019-10-20 MED ORDER — METHYLPREDNISOLONE SODIUM SUCC 125 MG IJ SOLR
125.0000 mg | Freq: Once | INTRAMUSCULAR | Status: AC
  Administered 2019-10-20: 125 mg via INTRAMUSCULAR

## 2019-10-20 NOTE — Patient Instructions (Addendum)
Stay on nasocort add afrin 4 days alternating at a time.  Will make neurology referral.   Eustachian Tube Dysfunction  Eustachian tube dysfunction refers to a condition in which a blockage develops in the narrow passage that connects the middle ear to the back of the nose (eustachian tube). The eustachian tube regulates air pressure in the middle ear by letting air move between the ear and nose. It also helps to drain fluid from the middle ear space. Eustachian tube dysfunction can affect one or both ears. When the eustachian tube does not function properly, air pressure, fluid, or both can build up in the middle ear. What are the causes? This condition occurs when the eustachian tube becomes blocked or cannot open normally. Common causes of this condition include:  Ear infections.  Colds and other infections that affect the nose, mouth, and throat (upper respiratory tract).  Allergies.  Irritation from cigarette smoke.  Irritation from stomach acid coming up into the esophagus (gastroesophageal reflux). The esophagus is the tube that carries food from the mouth to the stomach.  Sudden changes in air pressure, such as from descending in an airplane or scuba diving.  Abnormal growths in the nose or throat, such as: ? Growths that line the nose (nasal polyps). ? Abnormal growth of cells (tumors). ? Enlarged tissue at the back of the throat (adenoids). What increases the risk? You are more likely to develop this condition if:  You smoke.  You are overweight.  You are a child who has: ? Certain birth defects of the mouth, such as cleft palate. ? Large tonsils or adenoids. What are the signs or symptoms? Common symptoms of this condition include:  A feeling of fullness in the ear.  Ear pain.  Clicking or popping noises in the ear.  Ringing in the ear.  Hearing loss.  Loss of balance.  Dizziness. Symptoms may get worse when the air pressure around you changes, such as  when you travel to an area of high elevation, fly on an airplane, or go scuba diving. How is this diagnosed? This condition may be diagnosed based on:  Your symptoms.  A physical exam of your ears, nose, and throat.  Tests, such as those that measure: ? The movement of your eardrum (tympanogram). ? Your hearing (audiometry). How is this treated? Treatment depends on the cause and severity of your condition.  In mild cases, you may relieve your symptoms by moving air into your ears. This is called "popping the ears."  In more severe cases, or if you have symptoms of fluid in your ears, treatment may include: ? Medicines to relieve congestion (decongestants). ? Medicines that treat allergies (antihistamines). ? Nasal sprays or ear drops that contain medicines that reduce swelling (steroids). ? A procedure to drain the fluid in your eardrum (myringotomy). In this procedure, a small tube is placed in the eardrum to:  Drain the fluid.  Restore the air in the middle ear space. ? A procedure to insert a balloon device through the nose to inflate the opening of the eustachian tube (balloon dilation). Follow these instructions at home: Lifestyle  Do not do any of the following until your health care provider approves: ? Travel to high altitudes. ? Fly in airplanes. ? Work in a P[employer] scheme manager or room. ? Scuba dive.  Do not use any products that contain nicotine or tobacco, such as cigarettes and e-cigarettes. If you need help quitting, ask your health care provider.  Keep your ears  dry. Wear fitted earplugs during showering and bathing. Dry your ears completely after. General instructions  Take over-the-counter and prescription medicines only as told by your health care provider.  Use techniques to help pop your ears as recommended by your health care provider. These may include: ? Chewing gum. ? Yawning. ? Frequent, forceful swallowing. ? Closing your mouth, holding your nose  closed, and gently blowing as if you are trying to blow air out of your nose.  Keep all follow-up visits as told by your health care provider. This is important. Contact a health care provider if:  Your symptoms do not go away after treatment.  Your symptoms come back after treatment.  You are unable to pop your ears.  You have: ? A fever. ? Pain in your ear. ? Pain in your head or neck. ? Fluid draining from your ear.  Your hearing suddenly changes.  You become very dizzy.  You lose your balance. Summary  Eustachian tube dysfunction refers to a condition in which a blockage develops in the eustachian tube.  It can be caused by ear infections, allergies, inhaled irritants, or abnormal growths in the nose or throat.  Symptoms include ear pain, hearing loss, or ringing in the ears.  Mild cases are treated with maneuvers to unblock the ears, such as yawning or ear popping.  Severe cases are treated with medicines. Surgery may also be done (rare). This information is not intended to replace advice given to you by your health care provider. Make sure you discuss any questions you have with your health care provider. Document Revised: 09/03/2017 Document Reviewed: 09/03/2017 Elsevier Patient Education  [place].

## 2019-10-20 NOTE — Progress Notes (Signed)
Subjective:    Patient ID: Melanie Hart, female    DOB: 05-16-1961, 59 y.o.   MRN: [id number]  HPI  Pt is a 59 yo female who presents to the clinic for follow up on headache and abnormal brain MRI.   In early may she presented to the clinic with a intractable headache that had been going on for one month. She has no hx of migraines. Located occipital. She admitted to vision changes and speech blurriness. MRI and MRA were done. MRI did show some hyper densities moderate for age.  Dr. Darene Lamer discussed signs of chronic migraine and sent steroid pack. Her HA has resolved. Pt is concerned for MS and wants more evaluation. Long hx of double vision. Intermittent paresthesias. No hx of migraines. No lower extremity weakness.  BP remained high and started cozaar today. No CP, palpitations, headache or vision changes.   Pt has hx of ear pain without infection. She is having some right ear pain, popping and weird sensations of right ear. No fever, chills, body aches. No sinus pressure. She does have allergies. She uses allegra and nasocort daily.     .. Active Ambulatory Problems    Diagnosis Date Noted  . Depression 10/26/2013  . Stress at home 10/26/2013  . Benign essential hypertension 10/26/2013  . Allergic rhinitis 10/26/2013  . Overweight (BMI 25.0-29.9) 02/19/2014  . Hypertriglyceridemia 05/02/2014  . Double vision 01/24/2015  . QT prolongation 01/28/2015  . Cystocele 08/22/2015  . Absence of bladder continence 08/22/2015  . Hematuria 08/22/2015  . Vaginal dryness, menopausal 08/22/2015  . Dyslipidemia 06/11/2016  . Left hip pain 06/12/2016  . Paresthesia 08/08/2017  . Vitamin D deficiency 08/08/2017  . Seborrheic keratosis 08/08/2017  . Right thigh pain 09/24/2017  . Muscle cramps 09/24/2017  . Incontinence of feces with fecal urgency 09/24/2017  . Adenomatous colon polyp 10/17/2018  . Hemorrhage of colon following colonoscopy 02/27/2019  . Headache 09/25/2019  . Systolic murmur  0000000  . Abnormal MRI of head 10/20/2019   Resolved Ambulatory Problems    Diagnosis Date Noted  . No Resolved Ambulatory Problems   Past Medical History:  Diagnosis Date  . Allergy   . Arthritis   . Hypertension      Review of Systems See HPI.     Objective:   Physical Exam Vitals reviewed.  Constitutional:      Appearance: Normal appearance.  HENT:     Right Ear: Tympanic membrane and ear canal normal. There is no impacted cerumen.     Left Ear: Tympanic membrane and ear canal normal. There is no impacted cerumen.     Ears:     Comments: Right erythematous TM with retracted appearance.  Good light reflex.     Nose: No congestion.     Mouth/Throat:     Mouth: Mucous membranes are moist.  Cardiovascular:     Rate and Rhythm: Normal rate and regular rhythm.     Pulses: Normal pulses.  Pulmonary:     Effort: Pulmonary effort is normal.     Breath sounds: Normal breath sounds.  Neurological:     General: No focal deficit present.     Mental Status: She is alert and oriented to person, place, and time.  Psychiatric:        Mood and Affect: Mood normal.        Behavior: Behavior normal.           Assessment & Plan:  Marland KitchenMarland KitchenRuchama was seen today  for annual exam.  Diagnoses and all orders for this visit:  Routine physical examination  Encounter for screening mammogram for malignant neoplasm of breast -     MM 3D SCREEN BREAST BILATERAL  Eustachian tube dysfunction, bilateral -     methylPREDNISolone sodium succinate (SOLU-MEDROL) 125 mg/2 mL injection 125 mg  Double vision -     Ambulatory referral to Neurology  Paresthesia -     Ambulatory referral to Neurology  Acute nonintractable headache, unspecified headache type -     Ambulatory referral to Neurology  Abnormal MRI of head -     Ambulatory referral to Neurology  Benign essential hypertension   .[name] Discussed 150 minutes of exercise a week.  Encouraged vitamin D 1000 units and Calcium 1300mg   or 4 servings of dairy a day.  Mammogram ordered.  Colonoscopy UTD. Pap UTD. Fasting labs ordered and patient will have drawn.  Vaccines UTD.  shingrix done.   MRI results: Scattered subcortical T2 hyperintensities bilaterally are moderately advanced for age. The finding is nonspecific but can be seen in the setting of chronic microvascular ischemia, a demyelinating process such as multiple sclerosis, vasculitis, complicated migraine headaches, or as the sequelae of a prior infectious or inflammatory process.  Pt does not have a hx of migraines. HA has resolved. She has had ongoing double vision and intermittent paresthesias. She is concerned about would like neurologist to look at it. Will refer.   Reassured no infection of ear but retracted and erythematous. Continue nasocort add afrin for next 3 to 4 days. Solumedrol 125mg  given in office today. Consider decongestant for a few days. Follow up as needed. If not resolved consider medrol dose pack. Discussed chewing gum and exercises to relieve pressure.   BP not to goal. Started cozaar today back. Pt will check readings at work and then submit via mychart.    Follow up 6 months for BP recheck.

## 2019-11-19 ENCOUNTER — Encounter: Payer: Self-pay | Admitting: Neurology

## 2019-11-19 ENCOUNTER — Other Ambulatory Visit: Payer: Self-pay

## 2019-11-19 ENCOUNTER — Ambulatory Visit: Payer: 59 | Admitting: Neurology

## 2019-11-19 VITALS — BP 148/85 | HR 71 | Ht 62.0 in | Wt 155.8 lb

## 2019-11-19 DIAGNOSIS — R200 Anesthesia of skin: Secondary | ICD-10-CM

## 2019-11-19 DIAGNOSIS — H532 Diplopia: Secondary | ICD-10-CM

## 2019-11-19 DIAGNOSIS — R519 Headache, unspecified: Secondary | ICD-10-CM

## 2019-11-19 DIAGNOSIS — R930 Abnormal findings on diagnostic imaging of skull and head, not elsewhere classified: Secondary | ICD-10-CM

## 2019-11-19 DIAGNOSIS — M79604 Pain in right leg: Secondary | ICD-10-CM

## 2019-11-19 NOTE — Progress Notes (Signed)
[facility]  PATIENT: Melanie Hart DOB: Oct 12, 1960  REFERRING DOCTOR OR PCP: Iran Planas PA-C SOURCE: Patient, notes from primary care, laboratory reports, imaging reports, MRI images personally reviewed.  _________________________________   HISTORICAL  CHIEF COMPLAINT:  Chief Complaint  Patient presents with  . New Patient (Initial Visit)    RM 13, alone. Paper referral from Iran Planas, PA-C (PCP) for double vision, numbness, headache, abnormal MRI. Here to r/o MS. Works at [employer].   . Numbness    Has had foot numbness for over a year now. Denies any pain associated with this.  . Headache    Usually has 3-4 per year. Back in April 2021 she had a headache for a month straight. Medications only helped some.   . Diplopia    This is constant. Has had this for the last 4 yr. Had lasik surgery prior to this, wondering if this is related. Denies any injury.     HISTORY OF PRESENT ILLNESS:  I had the pleasure seeing your patient, Melanie Hart, at [facility] for neurologic consultation.   She is a 59 year old woman with diplopia, headaches and numbness and an abnormal brain MRI.  She has had diplopia x 4 years. Vision is down and to the right.  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   It occurs unilaterally with either eye or with both eyes and is worse with reading.     She has had headaches off/on x many years.   In the past, Excedrin migraine or similar medication would relieve the pain.   Since April, pain has been more intense.  She notes BP increased (had been on medications and was able to stop but then medications increased).   Pain was located in the back of the head /occiput.   Pain was present when she woke up and persisted. NSAIDs would help but not eliminate the pain.    She went back on her BP medications and was given a steroid shot with benefit.   Pain is milder  now but still present.    She has numbness in her right foot over the last year..   She has had right carpal tunnel syndrome which has done foot.    She woke up a few nights with excruciating right thigh pain - like a charley horse that didn't go away.   She was told she tore a muscle but pain occurred when she   This has improved after dry needling.    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    I personally reviewed the MRI of the brain performed last month.  It shows no atrophy.  She has scattered T2/FLAIR hyperintense foci predominantly in the subcortical and deep white matter.  None of the foci appear to be acute and they do not enhance.  I discussed with her that the pattern is most typical for chronic microvascular ischemic change.  The pattern would not be typical for demyelination from multiple sclerosis.  Vascular risk:   She has HTN (mild).  She never smoked and does not have DM.  She was once told she had scleroderma in the right arm.   She sleeps well at night.   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     REVIEW OF SYSTEMS: Constitutional: No fevers, chills, sweats, or change in appetite Eyes: No visual changes, double vision, eye pain Ear,  nose and throat: No hearing loss, ear pain, nasal congestion, sore throat Cardiovascular: No chest pain, palpitations Respiratory: No shortness of breath at rest or with exertion.   No wheezes GastrointestinaI: No nausea, vomiting, diarrhea, abdominal pain, fecal incontinence Genitourinary: No dysuria, urinary retention or frequency.  No nocturia. Musculoskeletal: No neck pain, back pain Integumentary: No rash, pruritus, skin lesions Neurological: as above Psychiatric: No depression at this time.  No anxiety Endocrine: No palpitations, diaphoresis, change in appetite, change in weigh or increased thirst Hematologic/Lymphatic: No anemia, purpura, petechiae. Allergic/Immunologic: She has seasonal allergies.  ALLERGIES: Allergies    Allergen Reactions  . Contrave [Naltrexone-Bupropion Hcl Er]     Dry mouth/constipation  . Lisinopril     Did not like the way it made her feel.     HOME MEDICATIONS:  Current Outpatient Medications:  .  CALCIUM-MAGNESUIUM-ZINC 333-133-8.3 MG TABS, , Disp: , Rfl:  .  Coenzyme Q10 (CO Q-10) 400 MG CAPS, , Disp: , Rfl:  .  fexofenadine (ALLEGRA) 180 MG tablet, Take 1 tablet (180 mg total) by mouth daily., Disp: 90 tablet, Rfl: 3 .  losartan (COZAAR) 50 MG tablet, Take 1 tablet (50 mg total) by mouth daily., Disp: 30 tablet, Rfl: 3 .  Omega-3 Fatty Acids (FISH OIL PO), Take by mouth., Disp: , Rfl:  .  triamcinolone (NASACORT) 55 MCG/ACT AERO nasal inhaler, Place 2 sprays into the nose daily., Disp: , Rfl:   PAST MEDICAL HISTORY: Past Medical History:  Diagnosis Date  . Allergy    seasonal  . Arthritis    hips,hands  . Hemorrhage of colon following colonoscopy 02/27/2019  . Hypertension    not currently on meds    PAST SURGICAL HISTORY: Past Surgical History:  Procedure Laterality Date  . COLONOSCOPY    . FLEXIBLE SIGMOIDOSCOPY N/A 02/27/2019   Procedure: FLEXIBLE SIGMOIDOSCOPY;  Surgeon: Gatha Mayer, MD;  Location: [facility] ENDOSCOPY;  Service: Endoscopy;  Laterality: N/A;  . HEMOSTASIS CLIP PLACEMENT  02/27/2019   Procedure: HEMOSTASIS CLIP PLACEMENT;  Surgeon: Gatha Mayer, MD;  Location: [facility];  Service: Endoscopy;;  . INCONTINENCE SURGERY    . wisdom teeth      FAMILY HISTORY: Family History  Problem Relation Age of Onset  . Hypertension Mother   . Colon polyps Mother 77       pre-cancer polyps  . Hypertension Sister   . Hyperlipidemia Brother   . Hypertension Brother   . Esophageal cancer Neg Hx   . Rectal cancer Neg Hx   . Stomach cancer Neg Hx   . Colon cancer Neg Hx     SOCIAL HISTORY:  Social History   Socioeconomic History  . Marital status: Married    Spouse name: Anjeanette Petzold   . Number of children: 2  . Years of education: Not on file  .  Highest education level: Not on file  Occupational History  . Occupation: Neurosurgeon Neurology    Employer: [employer]  Tobacco Use  . Smoking status: Never Smoker  . Smokeless tobacco: Never Used  Vaping Use  . Vaping Use: Never used  Substance and Sexual Activity  . Alcohol use: Not Currently  . Drug use: No  . Sexual activity: Yes  Other Topics Concern  . 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 Difficulty of Paying Living Expenses:   Food Insecurity:   . Worried About Charity fundraiser in the Last Year:   . Arboriculturist in the Last Year:   Transportation Needs:   . Film/video editor (Medical):   [name] Lack of Transportation (Non-Medical):   Physical Activity:   . Days of Exercise per Week:   . Minutes of Exercise per Session:   Stress:   . Feeling of Stress :   Social Connections:   . Frequency of Communication with Friends and Family:   . Frequency of Social Gatherings with Friends and Family:   . Attends Religious Services:   . Active Member of Clubs or Organizations:   . Attends Archivist Meetings:   [name] Marital Status:   Intimate Partner Violence:   . Fear of Current or Ex-Partner:   . Emotionally Abused:   [name] Physically Abused:   . Sexually Abused:      PHYSICAL EXAM  Vitals:   11/19/19 0849  BP: (!) 148/85  Pulse: 71  Weight: 155 lb 12.8 oz (70.7 kg)  Height: 5\' 2"  (1.575 m)    Body mass index is 28.5 kg/m.  REPEAT BP 155/90 (right arm)  General: The patient is well-developed and well-nourished and in no acute distress  HEENT:  Head is [place]/AT.  Sclera are anicteric.  Funduscopic exam shows normal optic discs and retinal vessels.  Neck: No carotid bruits are noted.  The neck is nontender.  Cardiovascular: The heart  has a regular rate and rhythm with a normal S1 and S2. There were no murmurs, gallops or rubs.    Skin: Extremities are without rash or  edema.  Musculoskeletal:  Back is nontender  Neurologic Exam  Mental status: The patient is alert and oriented x 3 at the time of the examination. The patient has apparent normal recent and remote memory, with an apparently normal attention span and concentration ability.   Speech is normal.  Cranial nerves: Extraocular movements are full. Pupils are equal, round, and reactive to light and accomodation.  Visual fields are full.  Facial symmetry is present. There is good facial sensation to soft touch bilaterally.Facial strength is normal.  Trapezius and sternocleidomastoid strength is normal. No dysarthria is noted.  The tongue is midline, and the patient has symmetric elevation of the soft palate. No obvious hearing deficits are noted.  Motor:  Muscle bulk is normal.   Tone is normal. Strength is  5 / 5 in all 4 extremities.  Functional testing shows that she can squat and rise without difficulty.  She does not need to use her hands to get out of a chair.  She can stand on her heels and toes.  Sensory: Sensory testing is intact to pinprick, soft touch and vibration sensation in all 4 extremities.  Coordination: Cerebellar testing reveals good finger-nose-finger and heel-to-shin bilaterally.  Gait and station: Station is normal.   Gait is normal. Tandem gait is normal. Romberg is negative.   Reflexes: Deep tendon reflexes are symmetric and normal bilaterally.   Plantar responses are flexor.    DIAGNOSTIC DATA (LABS, IMAGING, TESTING) - I reviewed patient records, labs, notes, testing and imaging myself where available.  Lab Results  Component Value Date   WBC 5.6 08/02/2017   HGB 13.6 08/02/2017   HCT 38.5 08/02/2017   MCV 83.9 08/02/2017   PLT 257 08/02/2017  Component Value Date/Time   NA 141 09/25/2019 0917   K 4.3 09/25/2019 0917   CL  106 09/25/2019 0917   CO2 25 09/25/2019 0917   GLUCOSE 91 09/25/2019 0917   BUN 16 09/25/2019 0917   CREATININE 0.70 09/25/2019 0917   CALCIUM 10.1 09/25/2019 0917   PROT 7.4 09/24/2017 1551   ALBUMIN 4.1 06/07/2016 0858   AST 16 09/24/2017 1551   ALT 18 09/24/2017 1551   ALKPHOS 82 06/07/2016 0858   BILITOT 0.5 09/24/2017 1551   GFRNONAA 97 09/25/2019 0917   GFRAA 112 09/25/2019 0917   Lab Results  Component Value Date   CHOL 244 (H) 08/02/2017   HDL 43 (L) 08/02/2017   LDLCALC 167 (H) 08/02/2017   TRIG 183 (H) 08/02/2017   CHOLHDL 5.7 (H) 08/02/2017   No results found for: HGBA1C Lab Results  Component Value Date   VITAMINB12 571 09/24/2017   Lab Results  Component Value Date   TSH 1.32 08/02/2017       ASSESSMENT AND PLAN  Abnormal MRI of head - Plan: Homocysteine, Sedimentation rate, C-reactive protein, Pan-ANCA  Double vision - Plan: Homocysteine, Sedimentation rate, C-reactive protein, Pan-ANCA  Acute nonintractable headache, unspecified headache type - Plan: Sedimentation rate, C-reactive protein, Pan-ANCA  Numbness  Leg pain, right   In summary, Ms. Grajales is a 59 year old woman with an abnormal MRI of the brain who has experienced diplopia, right leg numbness and pain and weakness and headache.  Await with her ophthalmologist that the visual changes are more likely to be due to to a corneal issues rather than a neurologic issue as she has independent monocular diplopia.  The symptoms she had in her leg have improved and she is not experiencing any pain or numbness currently.  Therefore, it is difficult to know what might be causing the symptoms.  If they return, we will will investigate with an NCV/EMG and also consider lumbar imaging depending on the features and findings of the EMG.  Her headaches have also improved.  They were occipital and she likely had new onset chronic daily headache, possibly due to occipital neuralgia.  If pain returns consider a  tricyclic.  I showed her her MRI images and we had a long discussion about them.  The pattern of changes is most consistent with chronic microvascular ischemic change.  This can occur with age but she has more than expected for mid 28s.  Her only risk factor is hypertension.  I will want to recheck an MRI in about a year to determine if there is any progression.  Today we will check some blood work for vasculitis though that is a rare cause of MRI changes.  We will also check homocysteine and treat if elevated.  He will return to see me in 1 year or sooner for new or worsening neurologic symptoms.  Thank you for asking me to see Ms. Sherpa.  Please let me know on been further assistance with her or other patients in the future.   Alexandre Faries A. Felecia Shelling, MD, [facility] 9/38/1017, 5:10 AM Certified in Neurology, Clinical Neurophysiology, Sleep Medicine and Neuroimaging  [facility] [address], [place], [place] [address] [phone]

## 2019-11-20 ENCOUNTER — Encounter: Payer: Self-pay | Admitting: Physician Assistant

## 2019-11-20 LAB — PAN-ANCA
ANCA Proteinase 3: <3.5 U/mL (ref 0.0–3.5)
Atypical pANCA: <1:20 {titer}
C-ANCA: <1:20 {titer}
Myeloperoxidase Ab: <9 U/mL (ref 0.0–9.0)
P-ANCA: <1:20 {titer}

## 2019-11-20 LAB — C-REACTIVE PROTEIN: CRP: 1 mg/L (ref 0–10)

## 2019-11-20 LAB — SEDIMENTATION RATE: Sed Rate: 2 mm/hr (ref 0–40)

## 2019-11-20 LAB — HOMOCYSTEINE: Homocysteine: 10.5 umol/L (ref 0.0–14.5)

## 2019-11-26 DIAGNOSIS — H524 Presbyopia: Secondary | ICD-10-CM | POA: Diagnosis not present

## 2019-12-18 ENCOUNTER — Other Ambulatory Visit: Payer: Self-pay | Admitting: Sports Medicine

## 2019-12-18 DIAGNOSIS — I10 Essential (primary) hypertension: Secondary | ICD-10-CM

## 2019-12-18 MED ORDER — LOSARTAN POTASSIUM 100 MG PO TABS: 100.0000 mg | ORAL_TABLET | Freq: Every day | ORAL | 3 refills | Status: DC

## 2019-12-18 MED FILL — LOSARTAN POTASSIUM 100 MG T: 100 | 30 days supply | Qty: 30 | Fill #0

## 2020-01-21 MED FILL — LOSARTAN POTASSIUM 100 MG T: 100 | 90 days supply | Qty: 90 | Fill #1

## 2020-03-22 ENCOUNTER — Ambulatory Visit (INDEPENDENT_AMBULATORY_CARE_PROVIDER_SITE_OTHER): Payer: 59 | Admitting: Physician Assistant

## 2020-03-22 DIAGNOSIS — Z5329 Procedure and treatment not carried out because of patient's decision for other reasons: Secondary | ICD-10-CM

## 2020-03-22 NOTE — Progress Notes (Signed)
No show

## 2020-03-25 ENCOUNTER — Encounter: Payer: Self-pay | Admitting: Physician Assistant

## 2020-03-25 ENCOUNTER — Ambulatory Visit (INDEPENDENT_AMBULATORY_CARE_PROVIDER_SITE_OTHER): Payer: 59

## 2020-03-25 ENCOUNTER — Other Ambulatory Visit: Payer: Self-pay | Admitting: Physician Assistant

## 2020-03-25 ENCOUNTER — Ambulatory Visit (INDEPENDENT_AMBULATORY_CARE_PROVIDER_SITE_OTHER): Payer: 59 | Admitting: Physician Assistant

## 2020-03-25 ENCOUNTER — Other Ambulatory Visit: Payer: Self-pay

## 2020-03-25 VITALS — BP 153/80 | HR 73 | Temp 98.2°F | Wt 154.1 lb

## 2020-03-25 DIAGNOSIS — Z13220 Encounter for screening for lipoid disorders: Secondary | ICD-10-CM

## 2020-03-25 DIAGNOSIS — Z1329 Encounter for screening for other suspected endocrine disorder: Secondary | ICD-10-CM

## 2020-03-25 DIAGNOSIS — M25562 Pain in left knee: Secondary | ICD-10-CM

## 2020-03-25 DIAGNOSIS — I10 Essential (primary) hypertension: Secondary | ICD-10-CM

## 2020-03-25 DIAGNOSIS — Z131 Encounter for screening for diabetes mellitus: Secondary | ICD-10-CM

## 2020-03-25 DIAGNOSIS — R519 Headache, unspecified: Secondary | ICD-10-CM | POA: Diagnosis not present

## 2020-03-25 IMAGING — DX DG KNEE COMPLETE 4+V*L*
5 series · 5 of 5 positions shown · non-contrast
Comparison: None.

CLINICAL DATA: Left knee pain for 2-3 months.

EXAM:
LEFT KNEE - COMPLETE 4+ VIEW

[knee ap]
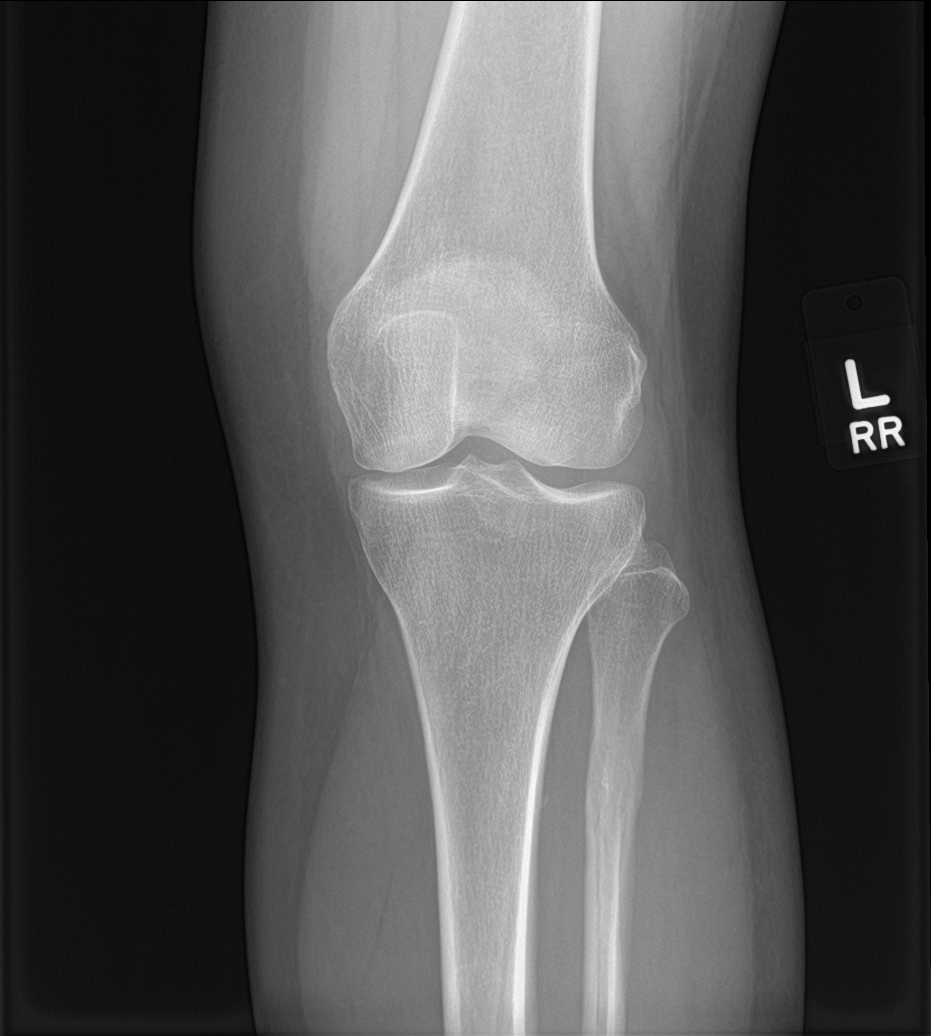

[knee lat]
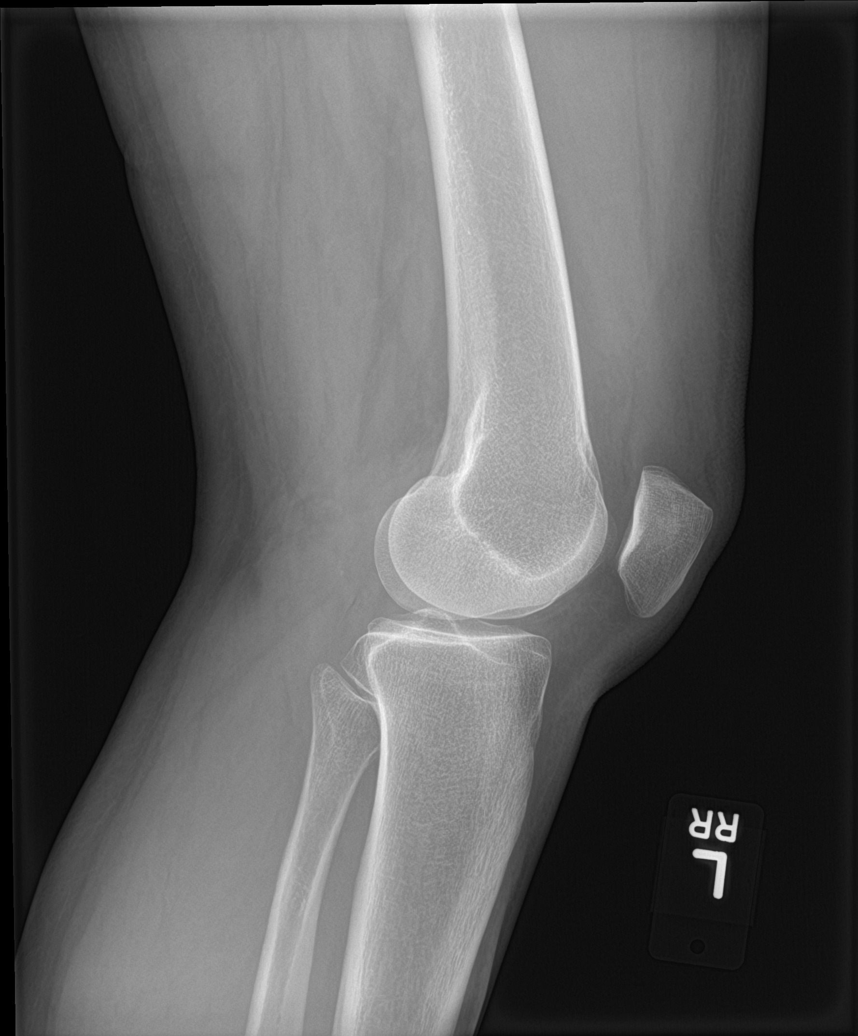

[knee sunrise]
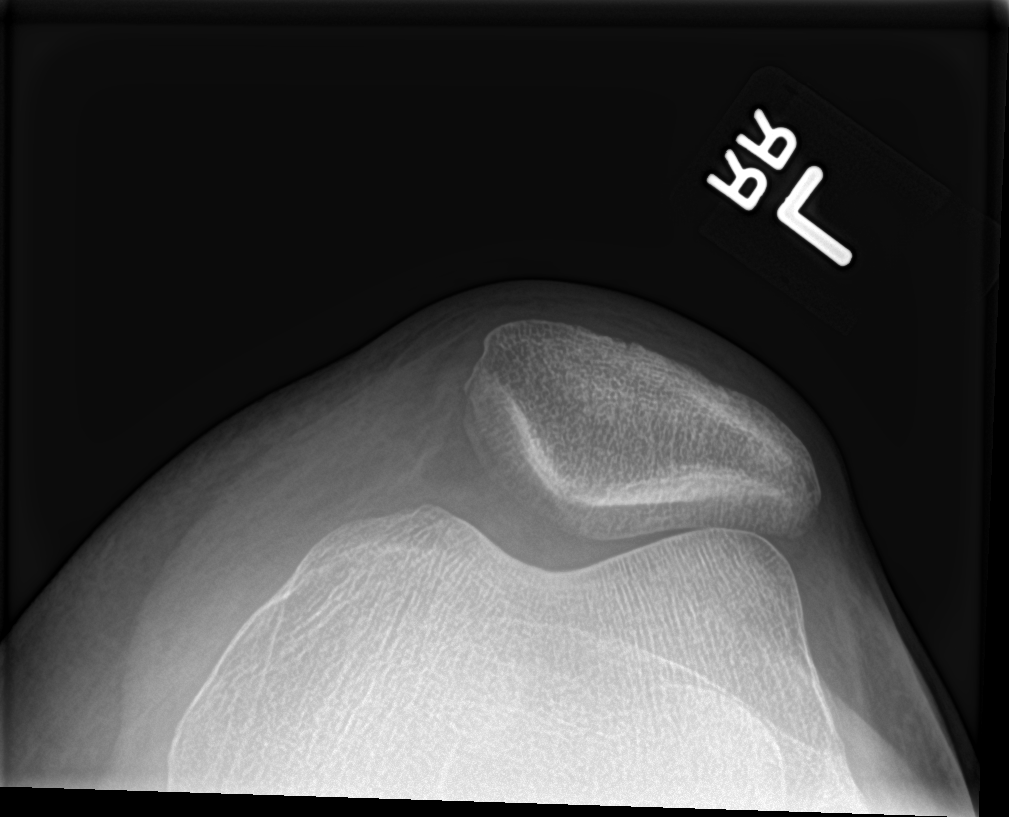

[knee obl (1 of 2)]
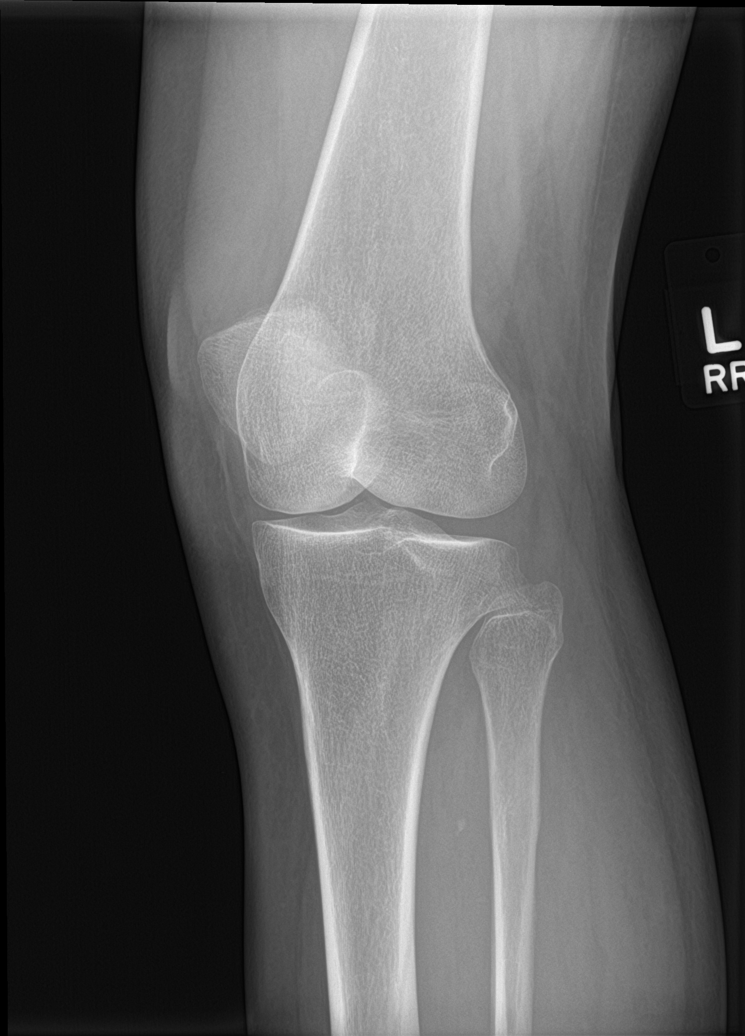

[knee obl (2 of 2)]
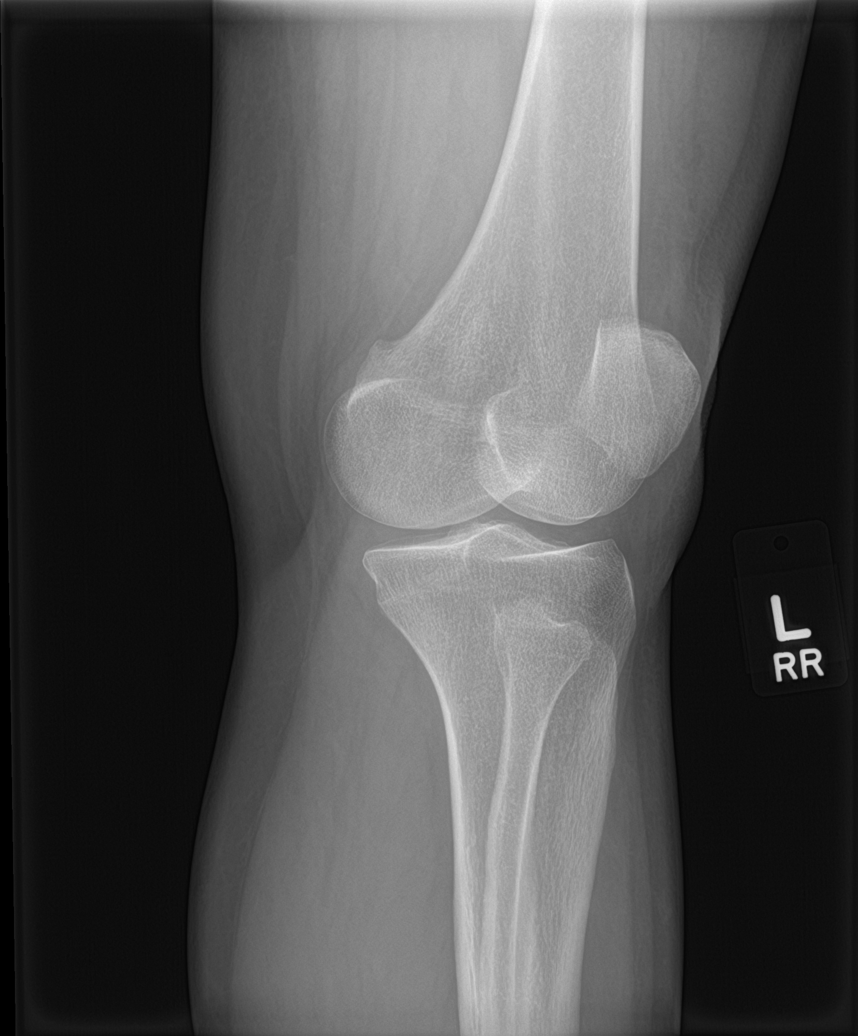

[5 of 5 positions shown; findings below may reference images not displayed]

FINDINGS: Tilting of the patella on the sunrise view. No fracture dislocation.
No significant degenerative changes. No effusion.
IMPRESSION: Tilting of the patella on the sunrise view. No other abnormalities.

## 2020-03-25 MED ORDER — LOSARTAN POTASSIUM-HCTZ 100-12.5 MG PO TABS: 1.0000 | ORAL_TABLET | Freq: Every day | ORAL | 0 refills | Status: DC

## 2020-03-25 MED ORDER — FEXOFENADINE HCL 180 MG PO TABS: 180.0000 mg | ORAL_TABLET | Freq: Every day | ORAL | 3 refills | Status: DC

## 2020-03-25 MED ORDER — MELOXICAM 15 MG PO TABS: 15.0000 mg | ORAL_TABLET | Freq: Every day | ORAL | 1 refills | Status: DC

## 2020-03-25 MED FILL — SM FEXOFENADINE 180 MG TAB: 180 MG | 90 days supply | Qty: 90 | Fill #0

## 2020-03-25 MED FILL — LOSARTAN-HCTZ 100-12.5 MG T: 100-12.5 | 90 days supply | Qty: 90 | Fill #0

## 2020-03-25 MED FILL — MELOXICAM 15 MG TABLET: 15 | 30 days supply | Qty: 30 | Fill #0

## 2020-03-25 NOTE — Patient Instructions (Signed)
Patellofemoral Pain Syndrome  Patellofemoral pain syndrome is a condition in which the tissue (cartilage) on the underside of the kneecap (patella) softens or breaks down. This causes pain in the front of the knee. The condition is also called runner's knee or chondromalacia patella. Patellofemoral pain syndrome is most common in young adults who are active in sports. The knee is the largest joint in the body. The patella covers the front of the knee and is attached to muscles above and below the knee. The underside of the patella is covered with a smooth type of cartilage (synovium). The smooth surface helps the patella to glide easily when you move your knee. Patellofemoral pain syndrome causes swelling in the joint linings and bone surfaces in the knee. What are the causes? This condition may be caused by:  Overuse of the knee.  Poor alignment of your knee joints.  Weak leg muscles.  A direct blow to your kneecap. What increases the risk? You are more likely to develop this condition if:  You do a lot of activities that can wear down your kneecap. These include: ? Running. ? Squatting. ? Climbing stairs.  You start a new physical activity or exercise program.  You wear shoes that do not fit well.  You do not have good leg strength.  You are overweight. What are the signs or symptoms? The main symptom of this condition is knee pain. This may feel like a dull, aching pain underneath your patella, in the front of your knee. There may be a popping or cracking sound when you move your knee. Pain may get worse with:  Exercise.  Climbing stairs.  Running.  Jumping.  Squatting.  Kneeling.  Sitting for a long time.  Moving or pushing on your patella. How is this diagnosed? This condition may be diagnosed based on:  Your symptoms and medical history. You may be asked about your recent physical activities and which ones cause knee pain.  A physical exam. This may  include: ? Moving your patella back and forth. ? Checking your range of knee motion. ? Having you squat or jump to see if you have pain. ? Checking the strength of your leg muscles.  Imaging tests to confirm the diagnosis. These may include an MRI of your knee. How is this treated? This condition may be treated at home with rest, ice, compression, and elevation (RICE).  Other treatments may include:  Nonsteroidal anti-inflammatory drugs (NSAIDs).  Physical therapy to stretch and strengthen your leg muscles.  Shoe inserts (orthotics) to take stress off your knee.  A knee brace or knee support.  Adhesive tapes to the skin.  Surgery to remove damaged cartilage or move the patella to a better position. This is rare. Follow these instructions at home: If you have a shoe or brace:  Wear the shoe or brace as told by your health care provider. Remove it only as told by your health care provider.  Loosen the shoe or brace if your toes tingle, become numb, or turn cold and blue.  Keep the shoe or brace clean.  If the shoe or brace is not waterproof: ? Do not let it get wet. ? Cover it with a watertight covering when you take a bath or a shower. Managing pain, stiffness, and swelling  If directed, put ice on the painful area. ? If you have a removable shoe or brace, remove it as told by your health care provider. ? Put ice in a plastic bag. ?  Place a towel between your skin and the bag. ? Leave the ice on for 20 minutes, 2-3 times a day.  Move your toes often to avoid stiffness and to lessen swelling.  Rest your knee: ? Avoid activities that cause knee pain. ? When sitting or lying down, raise (elevate) the injured area above the level of your heart, whenever possible. General instructions  Take over-the-counter and prescription medicines only as told by your health care provider.  Use splints, braces, knee supports, or walking aids as directed by your health care  provider.  Perform stretching and strengthening exercises as told by your health care provider or physical therapist.  Do not use any products that contain nicotine or tobacco, such as cigarettes and e-cigarettes. These can delay healing. If you need help quitting, ask your health care provider.  Return to your normal activities as told by your health care provider. Ask your health care provider what activities are safe for you.  Keep all follow-up visits as told by your health care provider. This is important. Contact a health care provider if:  Your symptoms get worse.  You are not improving with home care. Summary  Patellofemoral pain syndrome is a condition in which the tissue (cartilage) on the underside of the kneecap (patella) softens or breaks down.  This condition causes swelling in the joint linings and bone surfaces in the knee. This leads to pain in the front of the knee.  This condition may be treated at home with rest, ice, compression, and elevation (RICE).  Use splints, braces, knee supports, or walking aids as directed by your health care provider. This information is not intended to replace advice given to you by your health care provider. Make sure you discuss any questions you have with your health care provider. Document Revised: 06/24/2017 Document Reviewed: 06/24/2017 Elsevier Patient Education  2020 [employer].

## 2020-03-25 NOTE — Progress Notes (Signed)
Subjective:    Patient ID: Melanie Hart, female    DOB: [birth year], 59 y.o.   MRN: [id number]  HPI  Patient is a 59 year old female with hypertension and migraines who presents to the clinic for medication follow-up and to discuss new problem.  Patient has been on losartan since her blood pressure medication was added back.  She does not feel like this was the same medication she was on before.  She would like me to look into this.  Another provider added back losartan only.  She feels like it had the combination in it.  She denies any headaches, vision changes, chest pain, or palpitations.  She is checking her blood pressure at work and it seems to be more often than not over 140/90.  Patient is currently having some problems with her left knee pain for the last 3 months.  She denies any injury.  The pain is more noticeable when she gets up from a seated or kneeling position and then when she starts going and walking improves.  She walks all day long and is fine until she rest and then gets back up.  She almost feels like something is going to pop when she flexes or extends her knees.  When she gets past that point feels much better.  She has not really tried anything to make better.  .. Active Ambulatory Problems    Diagnosis Date Noted  . Depression 10/26/2013  . Stress at home 10/26/2013  . Benign essential hypertension 10/26/2013  . Allergic rhinitis 10/26/2013  . Overweight (BMI 25.0-29.9) 02/19/2014  . Hypertriglyceridemia 05/02/2014  . Double vision 01/24/2015  . QT prolongation 01/28/2015  . Cystocele 08/22/2015  . Absence of bladder continence 08/22/2015  . Hematuria 08/22/2015  . Vaginal dryness, menopausal 08/22/2015  . Dyslipidemia 06/11/2016  . Left hip pain 06/12/2016  . Paresthesia 08/08/2017  . Vitamin D deficiency 08/08/2017  . Seborrheic keratosis 08/08/2017  . Leg pain, right 09/24/2017  . Muscle cramps 09/24/2017  . Incontinence of feces with fecal urgency  09/24/2017  . Adenomatous colon polyp 10/17/2018  . Hemorrhage of colon following colonoscopy 02/27/2019  . Headache 09/25/2019  . Systolic murmur 89/16/9450  . Abnormal MRI of head 10/20/2019  . Numbness 11/19/2019   Resolved Ambulatory Problems    Diagnosis Date Noted  . No Resolved Ambulatory Problems   Past Medical History:  Diagnosis Date  . Allergy   . Arthritis   . Hypertension       Review of Systems   see HPI.  Objective:   Physical Exam Vitals reviewed.  Constitutional:      Appearance: Normal appearance.  HENT:     Head: Normocephalic.  Cardiovascular:     Rate and Rhythm: Normal rate and regular rhythm.     Pulses: Normal pulses.     Heart sounds: Murmur heard.   Pulmonary:     Effort: Pulmonary effort is normal.     Breath sounds: Normal breath sounds.  Musculoskeletal:     Right lower leg: No edema.     Left lower leg: No edema.     Comments: NROM of left knee. Minimal discomfort when passively extending left knee and some feelings of crepitus.   No effusion, redness or warmth.  There is some pinpoint discomfort with palpating around patella.  Negative anterior drawer.  Negative mcmurrays.  Strength 5/5 left leg.   Neurological:     General: No focal deficit present.     Mental Status:  She is alert and oriented to person, place, and time.  Psychiatric:        Mood and Affect: Mood normal.           Assessment & Plan:  Marland KitchenMarland KitchenJerrilynn was seen today for leg pain.  Diagnoses and all orders for this visit:  Acute pain of left knee -     meloxicam (MOBIC) 15 MG tablet; Take 1 tablet (15 mg total) by mouth daily. -     DG Knee 4 Views W/Patella Left  Benign essential hypertension -     losartan-hydrochlorothiazide (HYZAAR) 100-12.5 MG tablet; Take 1 tablet by mouth daily. -     COMPLETE METABOLIC PANEL WITH GFR  Acute nonintractable headache, unspecified headache type -     fexofenadine (ALLEGRA) 180 MG tablet; Take 1 tablet (180 mg total) by  mouth daily.  Thyroid disorder screen -     TSH  Screening for lipid disorders -     Lipid Panel w/reflex Direct LDL  Screening for diabetes mellitus -     COMPLETE METABOLIC PANEL WITH GFR   Medications refilled.  Blood pressure medicine changed back to Hyzaar from losartan only.  Follow-up with blood pressure readings in 4 weeks.  Ordered CMP to recheck potassium after starting new medication. Other fasting labs were ordered for regular health maintenance.  I suspect left knee pain is coming from some sort of patellofemoral syndrome/pain.  No signs of meniscal or ligamental injuries.  Meloxicam given to start once a day for the next 2 weeks.  Encouraged icing.  Gave her some exercises.  Encourage patellar strap.  Will get x-ray today and let her know if anything else changes.

## 2020-03-28 ENCOUNTER — Encounter: Payer: Self-pay | Admitting: Physician Assistant

## 2020-03-28 NOTE — Progress Notes (Signed)
Melanie Hart,   No significant arthritis in knee. You do have a tilted patella. This certainly could be causing your pain. I would suggest formal physical therapy instead of at home exercises. At times taping could help for knee. Icing and NSAIDs are very effective. If not responding at times surgery is an option but not after trying these things first. Are you ok with formal physical therapy? What location best for you?

## 2020-04-07 ENCOUNTER — Telehealth: Payer: Self-pay | Admitting: Neurology

## 2020-04-07 DIAGNOSIS — M25562 Pain in left knee: Secondary | ICD-10-CM

## 2020-04-07 NOTE — Telephone Encounter (Signed)
Patient called back and requests referral to physical therapy after being seen for knee pain. Referral placed for physical therapy. They will call patient.

## 2020-04-26 ENCOUNTER — Ambulatory Visit: Payer: 59 | Admitting: Rehabilitative and Restorative Service Providers"

## 2020-04-26 ENCOUNTER — Other Ambulatory Visit: Payer: Self-pay

## 2020-04-26 DIAGNOSIS — R29898 Other symptoms and signs involving the musculoskeletal system: Secondary | ICD-10-CM | POA: Diagnosis not present

## 2020-04-26 DIAGNOSIS — M25562 Pain in left knee: Secondary | ICD-10-CM | POA: Diagnosis not present

## 2020-04-26 DIAGNOSIS — M6281 Muscle weakness (generalized): Secondary | ICD-10-CM

## 2020-04-26 NOTE — Therapy (Signed)
[place], [place], [address] Phone: [phone]   Fax:  [phone]  Physical Therapy Evaluation  Patient Details  Name: Melanie Hart MRN: [id number] Date of Birth: [birth year] Referring Provider (PT): Iran Planas, [place]   Encounter Date: 04/26/2020   PT End of Session - 04/26/20 0930    Visit Number 1    Number of Visits 12    Date for PT Re-Evaluation 06/07/20    Authorization Type UMR    PT Start Time 0845    PT Stop Time 0929    PT Time Calculation (min) 44 min           Past Medical History:  Diagnosis Date  . Allergy    seasonal  . Arthritis    hips,hands  . Hemorrhage of colon following colonoscopy 02/27/2019  . Hypertension    not currently on meds    Past Surgical History:  Procedure Laterality Date  . COLONOSCOPY    . FLEXIBLE SIGMOIDOSCOPY N/A 02/27/2019   Procedure: FLEXIBLE SIGMOIDOSCOPY;  Surgeon: Gatha Mayer, MD;  Location: [facility] ENDOSCOPY;  Service: Endoscopy;  Laterality: N/A;  . HEMOSTASIS CLIP PLACEMENT  02/27/2019   Procedure: HEMOSTASIS CLIP PLACEMENT;  Surgeon: Gatha Mayer, MD;  Location: [facility];  Service: Endoscopy;;  . INCONTINENCE SURGERY    . wisdom teeth      There were no vitals filed for this visit.    Subjective Assessment - 04/26/20 0848    Subjective The patient reports pain x 6 months in the L knee.  She notices pain when transitioning from sitting to walking and when moving at night.  She gets a deep pain under the kneecap.    Pertinent History h/o muscle tear R quad, h/o wearing orthopedic shoes as a child L ankle    Currently in Pain? Yes    Pain Score --   None at rest, goes up to 8/10   Pain Location Knee    Pain Orientation Left    Pain Descriptors / Indicators Sharp;Shooting    Pain Type Acute pain    Pain Onset More than a month ago    Pain Frequency Intermittent    Aggravating Factors  moving after being still    Pain  Relieving Factors walking, activity    Effect of Pain on Daily Activities wakes her at night, impacts work activites.              [facility] PT Assessment - 04/26/20 0851      Assessment   Medical Diagnosis L knee pain    Referring Provider (PT) Iran Planas, PA-C    Onset Date/Surgical Date 04/07/20    Hand Dominance Right      Precautions   Precautions None      Restrictions   Weight Bearing Restrictions No      Balance Screen   Has the patient fallen in the past 6 months No    Has the patient had a decrease in activity level because of a fear of falling?  No    Is the patient reluctant to leave their home because of a fear of falling?  No      Home Ecologist residence      Prior Function   Level of Independence Independent    Vocation Part time employment    Vocation Requirements 24-30 hours/week      Observation/Other Assessments   Focus on Therapeutic Outcomes (  FOTO)  19% limitation      Sensation   Light Touch Appears Intact      ROM / Strength   AROM / PROM / Strength AROM;Strength      AROM   Overall AROM  Deficits    Overall AROM Comments notes hip pain at times too    AROM Assessment Site Knee    Right/Left Knee Left    Left Knee Extension 0    Left Knee Flexion 120   some discomfort end range flexion     Strength   Overall Strength Deficits    Strength Assessment Site Hip;Knee;Ankle    Right/Left Hip Right;Left    Right Hip Flexion 5/5    Right Hip ABduction 5/5    Left Hip Flexion 5/5    Left Hip ABduction 4+/5    Right/Left Knee Right;Left    Right Knee Flexion 5/5    Right Knee Extension 5/5    Left Knee Flexion 4+/5    Left Knee Extension 5/5    Right/Left Ankle Right;Left    Right Ankle Dorsiflexion 5/5    Right Ankle Plantar Flexion 5/5    Left Ankle Dorsiflexion 4+/5    Left Ankle Plantar Flexion 4+/5      Special Tests    Special Tests Knee Special Tests    Knee Special tests  Patellofemoral  Apprehension Test;Patellofemoral Grind Test (Clarke's Sign)      Patellofemoral Apprehension Test    Findings Negative    Side  Left    Comments no pain, but hyermobile patella noted      Patellofemoral Grind test (Clark's Sign)   Findings Postive    Side  Left    Comments positive for clicking      Ambulation/Gait   Ambulation/Gait Yes    Ambulation/Gait Assistance 7: Independent    Stairs Yes    Stairs Assistance 7: Independent    Number of Stairs 4      Balance   Balance Assessed --   10 second SLS each leg                     Objective measurements completed on examination: See above findings.       [facility] Adult PT Treatment/Exercise - 04/26/20 0851      Exercises   Exercises Knee/Hip      Knee/Hip Exercises: Stretches   Active Hamstring Stretch Right;Left;1 rep;30 seconds    ITB Stretch Right;Left;1 rep;30 seconds    Gastroc Stretch Right;Left;1 rep;30 seconds      Knee/Hip Exercises: Standing   Heel Raises Right;Left;20 reps                  PT Education - 04/26/20 0930    Education Details HEP    Person(s) Educated Patient    Methods Explanation;Demonstration;Handout    Comprehension Verbalized understanding;Returned demonstration               PT Long Term Goals - 04/26/20 0931      PT LONG TERM GOAL #1   Title The patient will be indep with HEP.    Time 6    Period Weeks    Target Date 06/07/20      PT LONG TERM GOAL #2   Title The patient will improve FOTO to < or equal to 16% limitation.    Baseline 19%    Time 6    Period Weeks    Target Date 06/07/20  PT LONG TERM GOAL #3   Title The patient will report pain dec'd at night to no night waking.    Time 6    Period Weeks    Target Date 06/07/20      PT LONG TERM GOAL #4   Title The patient will demonstrate end range knee flexion L knee without pain.    Time 6    Period Weeks    Target Date 06/07/20      PT LONG TERM GOAL #5   Title The patient will  improve L hip strength to 5/5.    Time 6    Period Weeks    Target Date 06/07/20                  Plan - 04/26/20 0932    Clinical Impression Statement The patient is a 59 yo female presenting to OP physical therapy with L knee pain x 6 months.  She presents with impairments in patellar stability L knee, hip strength, ankle strength, muscle length IT band and HS.  PT to address deficits to promote improved mobility and dec'd pain.    Examination-Activity Limitations Locomotion Level;Sleep;Squat;Stand    Examination-Participation Restrictions Occupation    Stability/Clinical Decision Making Stable/Uncomplicated    Clinical Decision Making Low    Rehab Potential Good    PT Frequency 2x / week    PT Duration 6 weeks    PT Treatment/Interventions ADLs/Self Care Home Management;Gait training;Stair training;Functional mobility training;Therapeutic activities;Therapeutic exercise;Electrical Stimulation;Iontophoresis 4mg /ml Dexamethasone;Cryotherapy;Moist Heat;Manual techniques;Taping;Dry needling;Patient/family education    PT Next Visit Plan progress VMO strengthening, work on standing glut med strengthening, STM/DN for IT Band/quad/HS    PT Home Exercise Plan Access Code: [email or web address]    Consulted and Agree with Plan of Care Patient           Patient will benefit from skilled therapeutic intervention in order to improve the following deficits and impairments:  Pain, Hypermobility, Impaired flexibility, Decreased strength, Increased fascial restricitons  Visit Diagnosis: Acute pain of left knee  Muscle weakness (generalized)  Other symptoms and signs involving the musculoskeletal system     Problem List Patient Active Problem List   Diagnosis Date Noted  . Numbness 11/19/2019  . Abnormal MRI of head 10/20/2019  . Headache 09/25/2019  . Systolic murmur 27/10/2374  . Hemorrhage of colon following colonoscopy 02/27/2019  . Adenomatous colon polyp 10/17/2018  . Leg pain, right  09/24/2017  . Muscle cramps 09/24/2017  . Incontinence of feces with fecal urgency 09/24/2017  . Paresthesia 08/08/2017  . Vitamin D deficiency 08/08/2017  . Seborrheic keratosis 08/08/2017  . Left hip pain 06/12/2016  . Dyslipidemia 06/11/2016  . Cystocele 08/22/2015  . Absence of bladder continence 08/22/2015  . Hematuria 08/22/2015  . Vaginal dryness, menopausal 08/22/2015  . QT prolongation 01/28/2015  . Double vision 01/24/2015  . Hypertriglyceridemia 05/02/2014  . Overweight (BMI 25.0-29.9) 02/19/2014  . Depression 10/26/2013  . Stress at home 10/26/2013  . Benign essential hypertension 10/26/2013  . Allergic rhinitis 10/26/2013    Seena Face, PT 04/26/2020, 12:43 PM  [facility] [place], [place], [address] Phone: [phone]   Fax:  [phone]  Name: MARLAYA TURCK MRN: [id number] Date of Birth: 04-23-1961

## 2020-04-26 NOTE — Patient Instructions (Signed)
Access Code: [email or web address] URL: [email or web address] Date: 04/26/2020 Prepared by: Rudell Cobb  Exercises Seated Hamstring Stretch with Chair - 2 x daily - 7 x weekly - 1 sets - 3 reps - 30-45 seconds hold Sidelying Hip Abduction - 2 x daily - 7 x weekly - 1 sets - 10 reps Supine ITB Stretch - 2 x daily - 7 x weekly - 1 sets - 10 reps Standing ITB Stretch - 2 x daily - 7 x weekly - 1 sets - 2 reps - 30 seconds hold Gastroc Stretch on Wall - 2 x daily - 7 x weekly - 1 sets - 10 reps Single Leg Heel Raise with Counter Support - 2 x daily - 7 x weekly - 1 sets - 20 reps

## 2020-04-29 ENCOUNTER — Encounter: Payer: 59 | Admitting: Rehabilitative and Restorative Service Providers"

## 2020-05-03 ENCOUNTER — Encounter: Payer: Self-pay | Admitting: Physical Therapy

## 2020-05-03 ENCOUNTER — Other Ambulatory Visit: Payer: Self-pay

## 2020-05-03 ENCOUNTER — Ambulatory Visit: Payer: 59 | Admitting: Physical Therapy

## 2020-05-03 DIAGNOSIS — R29898 Other symptoms and signs involving the musculoskeletal system: Secondary | ICD-10-CM | POA: Diagnosis not present

## 2020-05-03 DIAGNOSIS — M6281 Muscle weakness (generalized): Secondary | ICD-10-CM | POA: Diagnosis not present

## 2020-05-03 DIAGNOSIS — M25562 Pain in left knee: Secondary | ICD-10-CM

## 2020-05-03 NOTE — Therapy (Signed)
[place], [place], [address] Phone: [phone]   Fax:  [phone]  Physical Therapy Treatment  Patient Details  Name: Melanie Hart MRN: [id number] Date of Birth: 14-Jan-1961 Referring Provider (PT): Iran Planas, [place]   Encounter Date: 05/03/2020   PT End of Session - 05/03/20 1024    Visit Number 2    Number of Visits 12    Date for PT Re-Evaluation 06/07/20    Authorization Type UMR    PT Start Time 1020    PT Stop Time 1101    PT Time Calculation (min) 41 min           Past Medical History:  Diagnosis Date  . Allergy    seasonal  . Arthritis    hips,hands  . Hemorrhage of colon following colonoscopy 02/27/2019  . Hypertension    not currently on meds    Past Surgical History:  Procedure Laterality Date  . COLONOSCOPY    . FLEXIBLE SIGMOIDOSCOPY N/A 02/27/2019   Procedure: FLEXIBLE SIGMOIDOSCOPY;  Surgeon: Gatha Mayer, MD;  [facility] ENDOSCOPY;  Service: Endoscopy;  Laterality: N/A;  . HEMOSTASIS CLIP PLACEMENT  02/27/2019   Procedure: HEMOSTASIS CLIP PLACEMENT;  Surgeon: Gatha Mayer, MD;  Location: [place];  Service: Endoscopy;;  . INCONTINENCE SURGERY    . wisdom teeth      There were no vitals filed for this visit.   Subjective Assessment - 05/03/20 1026    Subjective No changes since last visit.  She has her husband occasionally do some TPR to her Lt distal ITB with relief.  She has been doing HEP 2x/day.    Pertinent History h/o muscle tear R quad, h/o wearing orthopedic shoes as a child L ankle    Currently in Pain? No/denies    Pain Score 0-No pain              [facility] PT Assessment - 05/03/20 0001      Assessment   Medical Diagnosis L knee pain    Referring Provider (PT) Iran Planas, PA-C    Onset Date/Surgical Date 04/07/20    Hand Dominance Right    Next MD Visit PRN             [facility] Adult PT Treatment/Exercise - 05/03/20 0001       Self-Care   Self-Care Other Self-Care Comments    Other Self-Care Comments  pt advised to avoided sitting long periods with Lt knee flexed under chair to avoid tightening of HS.       Knee/Hip Exercises: Stretches   Active Hamstring Stretch Left;3 reps;Right;1 rep;30 seconds    Active Hamstring Stretch Limitations shown how to point toes med/laterally to stretch different fibers of HS.     Quad Programmer, multimedia reps;Right;1 rep;30 seconds   prone with strap, towel above knee   ITB Stretch Left;2 reps;20 seconds   standing   Piriformis Stretch Left;Right;2 reps;20 seconds    Gastroc Stretch Left;1 rep;30 seconds   runners stretch     Knee/Hip Exercises: Aerobic   Nustep L4: 5.5 min legs only for warm up.       Knee/Hip Exercises: Supine   Straight Leg Raise with External Rotation Strengthening;1 set;Right;5 reps;2 sets;10 reps;Left      Knee/Hip Exercises: Sidelying   Hip ABduction Strengthening;Left;1 set;10 reps      Manual Therapy   Manual Therapy Soft tissue mobilization    Soft tissue mobilization IASTM and STM  to Lt lateral distal hamstring, and proximal gastroc to decrease fascial restrictions and improve mobility.                        PT Long Term Goals - 04/26/20 0931      PT LONG TERM GOAL #1   Title The patient will be indep with HEP.    Time 6    Period Weeks    Target Date 06/07/20      PT LONG TERM GOAL #2   Title The patient will improve FOTO to < or equal to 16% limitation.    Baseline 19%    Time 6    Period Weeks    Target Date 06/07/20      PT LONG TERM GOAL #3   Title The patient will report pain dec'd at night to no night waking.    Time 6    Period Weeks    Target Date 06/07/20      PT LONG TERM GOAL #4   Title The patient will demonstrate end range knee flexion L knee without pain.    Time 6    Period Weeks    Target Date 06/07/20      PT LONG TERM GOAL #5   Title The patient will improve L hip strength to 5/5.    Time 6     Period Weeks    Target Date 06/07/20                 Plan - 05/03/20 1718    Clinical Impression Statement Pt reported some weakness/fatigue in LLE with strengthening exercises today, compared to RLE.  She reported some reduction in tightness in Lt posterior knee after IASTM.  Pt may benefit from DN  to Lt biceps femoris in future session.  Goals are ongoing.    Examination-Activity Limitations Locomotion Level;Sleep;Squat;Stand    Examination-Participation Restrictions Occupation    Stability/Clinical Decision Making Stable/Uncomplicated    Rehab Potential Good    PT Frequency 2x / week    PT Duration 6 weeks    PT Treatment/Interventions ADLs/Self Care Home Management;Gait training;Stair training;Functional mobility training;Therapeutic activities;Therapeutic exercise;Electrical Stimulation;Iontophoresis 4mg /ml Dexamethasone;Cryotherapy;Moist Heat;Manual techniques;Taping;Dry needling;Patient/family education    PT Next Visit Plan progress VMO strengthening, work on standing glut med strengthening, STM/DN for IT Band/quad/HS    PT Home Exercise Plan Access Code: [email or web address]    Consulted and Agree with Plan of Care Patient           Patient will benefit from skilled therapeutic intervention in order to improve the following deficits and impairments:  Pain, Hypermobility, Impaired flexibility, Decreased strength, Increased fascial restricitons  Visit Diagnosis: Acute pain of left knee  Muscle weakness (generalized)  Other symptoms and signs involving the musculoskeletal system     Problem List Patient Active Problem List   Diagnosis Date Noted  . Numbness 11/19/2019  . Abnormal MRI of head 10/20/2019  . Headache 09/25/2019  . Systolic murmur 44/31/5400  . Hemorrhage of colon following colonoscopy 02/27/2019  . Adenomatous colon polyp 10/17/2018  . Leg pain, right 09/24/2017  . Muscle cramps 09/24/2017  . Incontinence of feces with fecal urgency 09/24/2017  .  Paresthesia 08/08/2017  . Vitamin D deficiency 08/08/2017  . Seborrheic keratosis 08/08/2017  . Left hip pain 06/12/2016  . Dyslipidemia 06/11/2016  . Cystocele 08/22/2015  . Absence of bladder continence 08/22/2015  . Hematuria 08/22/2015  . Vaginal dryness, menopausal 08/22/2015  . QT prolongation 01/28/2015  .  Double vision 01/24/2015  . Hypertriglyceridemia 05/02/2014  . Overweight (BMI 25.0-29.9) 02/19/2014  . Depression 10/26/2013  . Stress at home 10/26/2013  . Benign essential hypertension 10/26/2013  . Allergic rhinitis 10/26/2013    Kerin Perna, PTA 05/03/20 5:21 PM  [place], [place], [address] Phone: [phone]   Fax:  [phone]  Name: THERISA MENNELLA MRN: [id number] Date of Birth: Nov 10, 1960

## 2020-05-06 ENCOUNTER — Other Ambulatory Visit: Payer: Self-pay

## 2020-05-06 ENCOUNTER — Ambulatory Visit (INDEPENDENT_AMBULATORY_CARE_PROVIDER_SITE_OTHER): Payer: 59 | Admitting: Rehabilitative and Restorative Service Providers"

## 2020-05-06 ENCOUNTER — Encounter: Payer: Self-pay | Admitting: Rehabilitative and Restorative Service Providers"

## 2020-05-06 DIAGNOSIS — R29898 Other symptoms and signs involving the musculoskeletal system: Secondary | ICD-10-CM

## 2020-05-06 DIAGNOSIS — M25562 Pain in left knee: Secondary | ICD-10-CM | POA: Diagnosis not present

## 2020-05-06 DIAGNOSIS — M6281 Muscle weakness (generalized): Secondary | ICD-10-CM

## 2020-05-06 NOTE — Therapy (Signed)
[place], [place], [address] Phone: [phone]   Fax:  [phone]  Physical Therapy Treatment  Patient Details  Name: Melanie Hart MRN: [id number] Date of Birth: 1960/09/04 Referring Provider (PT): Iran Planas, [place]   Encounter Date: 05/06/2020   PT End of Session - 05/06/20 1324    Visit Number 3    Number of Visits 12    Date for PT Re-Evaluation 06/07/20    Authorization Type UMR    PT Start Time 1320    PT Stop Time 1400    PT Time Calculation (min) 40 min    Activity Tolerance Patient tolerated treatment well    Behavior During Therapy [facility] for tasks assessed/performed           Past Medical History:  Diagnosis Date  . Allergy    seasonal  . Arthritis    hips,hands  . Hemorrhage of colon following colonoscopy 02/27/2019  . Hypertension    not currently on meds    Past Surgical History:  Procedure Laterality Date  . COLONOSCOPY    . FLEXIBLE SIGMOIDOSCOPY N/A 02/27/2019   Procedure: FLEXIBLE SIGMOIDOSCOPY;  Surgeon: Gatha Mayer, MD;  Location: [facility] ENDOSCOPY;  Service: Endoscopy;  Laterality: N/A;  . HEMOSTASIS CLIP PLACEMENT  02/27/2019   Procedure: HEMOSTASIS CLIP PLACEMENT;  Surgeon: Gatha Mayer, MD;  Location: [facility];  Service: Endoscopy;;  . INCONTINENCE SURGERY    . wisdom teeth      There were no vitals filed for this visit.   Subjective Assessment - 05/06/20 1323    Subjective The patient is feeling some improvement in her knee.  She irritated the L low back yesterday morning.    Pertinent History h/o muscle tear R quad, h/o wearing orthopedic shoes as a child L ankle    Patient Stated Goals reduce pain after sitting    Currently in Pain? No/denies              [facility] PT Assessment - 05/06/20 1327      Assessment   Medical Diagnosis L knee pain    Referring Provider (PT) Iran Planas, PA-C    Onset Date/Surgical Date 04/07/20    Hand  Dominance Right                         [facility] Adult PT Treatment/Exercise - 05/06/20 1328      Exercises   Exercises Knee/Hip      Knee/Hip Exercises: Stretches   ITB Stretch Left;2 reps;20 seconds      Knee/Hip Exercises: Aerobic   Nustep L4 x 3 minutes      Knee/Hip Exercises: Standing   Lateral Step Up Left;10 reps    Forward Step Up Left;10 reps    Other Standing Knee Exercises WMO activation with wall press x 5 reps x 5 seconds      Manual Therapy   Manual Therapy Soft tissue mobilization;Taping    Manual therapy comments taping for L knee for patellar stability    Soft tissue mobilization IASTM and STM for L lateral HS, quads and IT band            Trigger Point Dry Needling - 05/06/20 1412    Consent Given? Yes    Education Handout Provided Yes    Muscles Treated Lower Quadrant Hamstring;Vastus lateralis    Dry Needling Comments L side    Vastus lateralis Response Twitch response elicited;Palpable  increased muscle length    Hamstring Response Twitch response elicited;Palpable increased muscle length                PT Education - 05/06/20 1529    Education Details HEP    Person(s) Educated Patient    Methods Explanation;Demonstration;Handout    Comprehension Verbalized understanding;Returned demonstration               PT Long Term Goals - 04/26/20 0931      PT LONG TERM GOAL #1   Title The patient will be indep with HEP.    Time 6    Period Weeks    Target Date 06/07/20      PT LONG TERM GOAL #2   Title The patient will improve FOTO to < or equal to 16% limitation.    Baseline 19%    Time 6    Period Weeks    Target Date 06/07/20      PT LONG TERM GOAL #3   Title The patient will report pain dec'd at night to no night waking.    Time 6    Period Weeks    Target Date 06/07/20      PT LONG TERM GOAL #4   Title The patient will demonstrate end range knee flexion L knee without pain.    Time 6    Period Weeks     Target Date 06/07/20      PT LONG TERM GOAL #5   Title The patient will improve L hip strength to 5/5.    Time 6    Period Weeks    Target Date 06/07/20                 Plan - 05/06/20 1542    Clinical Impression Statement The patient notes improving knee pain.  She had increased L QL pain yesterday and still notes some soreness.   PT to continue progressing to patient tolerance.    Examination-Activity Limitations Locomotion Level;Sleep;Squat;Stand    Examination-Participation Restrictions Occupation    Stability/Clinical Decision Making Stable/Uncomplicated    Rehab Potential Good    PT Frequency 2x / week    PT Duration 6 weeks    PT Treatment/Interventions ADLs/Self Care Home Management;Gait training;Stair training;Functional mobility training;Therapeutic activities;Therapeutic exercise;Electrical Stimulation;Iontophoresis 4mg /ml Dexamethasone;Cryotherapy;Moist Heat;Manual techniques;Taping;Dry needling;Patient/family education    PT Next Visit Plan progress VMO strengthening, work on standing glut med strengthening, STM/DN for IT Band/quad/HS    PT Home Exercise Plan Access Code: [email or web address]    Consulted and Agree with Plan of Care Patient           Patient will benefit from skilled therapeutic intervention in order to improve the following deficits and impairments:  Pain,Hypermobility,Impaired flexibility,Decreased strength,Increased fascial restricitons  Visit Diagnosis: Acute pain of left knee  Muscle weakness (generalized)  Other symptoms and signs involving the musculoskeletal system     Problem List Patient Active Problem List   Diagnosis Date Noted  . Numbness 11/19/2019  . Abnormal MRI of head 10/20/2019  . Headache 09/25/2019  . Systolic murmur 78/93/8101  . Hemorrhage of colon following colonoscopy 02/27/2019  . Adenomatous colon polyp 10/17/2018  . Leg pain, right 09/24/2017  . Muscle cramps 09/24/2017  . Incontinence of feces with fecal urgency  09/24/2017  . Paresthesia 08/08/2017  . Vitamin D deficiency 08/08/2017  . Seborrheic keratosis 08/08/2017  . Left hip pain 06/12/2016  . Dyslipidemia 06/11/2016  . Cystocele 08/22/2015  . Absence of bladder continence 08/22/2015  .  Hematuria 08/22/2015  . Vaginal dryness, menopausal 08/22/2015  . QT prolongation 01/28/2015  . Double vision 01/24/2015  . Hypertriglyceridemia 05/02/2014  . Overweight (BMI 25.0-29.9) 02/19/2014  . Depression 10/26/2013  . Stress at home 10/26/2013  . Benign essential hypertension 10/26/2013  . Allergic rhinitis 10/26/2013    Melanie Hart, PT 05/06/2020, [place], [place], [address] Phone: [phone]   Fax:  [phone]  Name: Melanie Hart MRN: [id number] Date of Birth: 1961-03-18

## 2020-05-06 NOTE — Patient Instructions (Signed)
Access Code: [email or web address] URL: [email or web address] Date: 05/06/2020 Prepared by: Rudell Cobb  Exercises Seated Hamstring Stretch with Chair - 2 x daily - 7 x weekly - 1 sets - 3 reps - 30-45 seconds hold Seated Table Piriformis Stretch - 1 x daily - 7 x weekly - 1 sets - 2-3 reps - 15-30 sec hold Standing ITB Stretch - 2 x daily - 7 x weekly - 1 sets - 2 reps - 30 seconds hold Gastroc Stretch on Wall - 2 x daily - 7 x weekly - 1 sets - 2 reps - 15-30 sec hold Single Leg Heel Raise with Counter Support - 1 x daily - 7 x weekly - 1 sets - 20 reps Sidelying Hip Abduction - 1 x daily - 7 x weekly - 2 sets - 10 reps Supine ITB Stretch - 2 x daily - 7 x weekly - 1 sets - 10 reps Straight Leg Raise with External Rotation - 1 x daily - 7 x weekly - 2 sets - 10 reps Prone Quad Stretch with Towel Roll and Strap - 1 x daily - 7 x weekly - 1 sets - 2-3 reps - 30 hold  Patient Education Kinesiology tape

## 2020-05-10 ENCOUNTER — Encounter: Payer: Self-pay | Admitting: Physical Therapy

## 2020-05-10 ENCOUNTER — Ambulatory Visit (INDEPENDENT_AMBULATORY_CARE_PROVIDER_SITE_OTHER): Payer: 59 | Admitting: Physical Therapy

## 2020-05-10 ENCOUNTER — Other Ambulatory Visit: Payer: Self-pay

## 2020-05-10 DIAGNOSIS — R29898 Other symptoms and signs involving the musculoskeletal system: Secondary | ICD-10-CM | POA: Diagnosis not present

## 2020-05-10 DIAGNOSIS — M25562 Pain in left knee: Secondary | ICD-10-CM

## 2020-05-10 DIAGNOSIS — M6281 Muscle weakness (generalized): Secondary | ICD-10-CM

## 2020-05-10 NOTE — Therapy (Signed)
[place], [place], [address] Phone: [phone]   Fax:  [phone]  Physical Therapy Treatment  Patient Details  Name: Melanie Hart MRN: [id number] Date of Birth: 1960-07-03 Referring Provider (PT): Iran Planas, [place]   Encounter Date: 05/10/2020   PT End of Session - 05/10/20 0851    Visit Number 4    Number of Visits 12    Date for PT Re-Evaluation 06/07/20    Authorization Type UMR    PT Start Time 0854    PT Stop Time 0923    PT Time Calculation (min) 29 min    Activity Tolerance Patient tolerated treatment well    Behavior During Therapy [facility] for tasks assessed/performed           Past Medical History:  Diagnosis Date  . Allergy    seasonal  . Arthritis    hips,hands  . Hemorrhage of colon following colonoscopy 02/27/2019  . Hypertension    not currently on meds    Past Surgical History:  Procedure Laterality Date  . COLONOSCOPY    . FLEXIBLE SIGMOIDOSCOPY N/A 02/27/2019   Procedure: FLEXIBLE SIGMOIDOSCOPY;  Surgeon: Gatha Mayer, MD;  Location: [facility] ENDOSCOPY;  Service: Endoscopy;  Laterality: N/A;  . HEMOSTASIS CLIP PLACEMENT  02/27/2019   Procedure: HEMOSTASIS CLIP PLACEMENT;  Surgeon: Gatha Mayer, MD;  Location: [place];  Service: Endoscopy;;  . INCONTINENCE SURGERY    . wisdom teeth      There were no vitals filed for this visit.   Subjective Assessment - 05/10/20 0855    Subjective Pt reports good relief with DN last session.  She states she "did well",  and did not do any of the exercises Sat, Sun, Mon.  This morning she woke up with pain 5/10, and stiff. Not waking in night due to pain.  Has adjusted sitting position at work.    Pertinent History h/o muscle tear R quad, h/o wearing orthopedic shoes as a child L ankle    Patient Stated Goals reduce pain after sitting    Currently in Pain? No/denies    Pain Score 0-No pain    Pain Orientation Left               [facility] PT Assessment - 05/10/20 0001      Assessment   Medical Diagnosis L knee pain    Referring Provider (PT) Iran Planas, PA-C    Onset Date/Surgical Date 04/07/20    Hand Dominance Right    Next MD Visit PRN      Strength   Left Hip ABduction --   5-/5           [facility] Adult PT Treatment/Exercise - 05/10/20 0001      Knee/Hip Exercises: Stretches   Passive Hamstring Stretch Left;2 reps;60 seconds   supine with strap   ITB Stretch Left;2 reps;30 seconds   supine with strap   Piriformis Stretch Right;Left;2 reps;20 seconds      Knee/Hip Exercises: Aerobic   Nustep L5: 3 min for warm up.      Knee/Hip Exercises: Standing   SLS Lt/Rt SLS forward leans to touch chair x 8 reps each leg (Pt reported increase tightness in Rt hamstrings, not LLE)      Knee/Hip Exercises: Sidelying   Hip ABduction Strengthening;Left;Right;1 set;10 reps      Manual Therapy   Soft tissue mobilization IASTM and STM for L hamstrings to decrease fascial tightness and improve  mobility.                       PT Long Term Goals - 05/10/20 0900      PT LONG TERM GOAL #1   Title The patient will be indep with HEP.    Time 6    Period Weeks    Status On-going      PT LONG TERM GOAL #2   Title The patient will improve FOTO to < or equal to 16% limitation.    Baseline 19%    Time 6    Period Weeks    Status On-going      PT LONG TERM GOAL #3   Title The patient will report pain dec'd at night to no night waking.    Time 6    Period Weeks    Status Partially Met      PT LONG TERM GOAL #4   Title The patient will demonstrate end range knee flexion L knee without pain.    Time 6    Period Weeks    Status On-going      PT LONG TERM GOAL #5   Title The patient will improve L hip strength to 5/5.    Time 6    Period Weeks    Status On-going                 Plan - 05/10/20 0859    Clinical Impression Statement Positive response to DN last session. Tightness  returned this morning in Lt hamstring. Some palpable tightness found in short head of biceps femoris and distal long head of biceps femoris and semimembranosus.  Reviewed HEP exercises, without any issues and trialed single leg forward leans with good tolerance.   Progressing towards goals.    Examination-Activity Limitations Locomotion Level;Sleep;Squat;Stand    Examination-Participation Restrictions Occupation    Stability/Clinical Decision Making Stable/Uncomplicated    Rehab Potential Good    PT Frequency 2x / week    PT Duration 6 weeks    PT Treatment/Interventions ADLs/Self Care Home Management;Gait training;Stair training;Functional mobility training;Therapeutic activities;Therapeutic exercise;Electrical Stimulation;Iontophoresis 4mg /ml Dexamethasone;Cryotherapy;Moist Heat;Manual techniques;Taping;Dry needling;Patient/family education    PT Next Visit Plan progress VMO strengthening, work on standing glut med strengthening, STM/DN for IT Band/quad/HS.  Assess need to retape Lt knee for patellar taping.    PT Home Exercise Plan Access Code: [email or web address]    Consulted and Agree with Plan of Care Patient           Patient will benefit from skilled therapeutic intervention in order to improve the following deficits and impairments:  Pain,Hypermobility,Impaired flexibility,Decreased strength,Increased fascial restricitons  Visit Diagnosis: Acute pain of left knee  Muscle weakness (generalized)  Other symptoms and signs involving the musculoskeletal system     Problem List Patient Active Problem List   Diagnosis Date Noted  . Numbness 11/19/2019  . Abnormal MRI of head 10/20/2019  . Headache 09/25/2019  . Systolic murmur 16/02/9603  . Hemorrhage of colon following colonoscopy 02/27/2019  . Adenomatous colon polyp 10/17/2018  . Leg pain, right 09/24/2017  . Muscle cramps 09/24/2017  . Incontinence of feces with fecal urgency 09/24/2017  . Paresthesia 08/08/2017  . Vitamin D  deficiency 08/08/2017  . Seborrheic keratosis 08/08/2017  . Left hip pain 06/12/2016  . Dyslipidemia 06/11/2016  . Cystocele 08/22/2015  . Absence of bladder continence 08/22/2015  . Hematuria 08/22/2015  . Vaginal dryness, menopausal 08/22/2015  . QT prolongation 01/28/2015  . Double vision  01/24/2015  . Hypertriglyceridemia 05/02/2014  . Overweight (BMI 25.0-29.9) 02/19/2014  . Depression 10/26/2013  . Stress at home 10/26/2013  . Benign essential hypertension 10/26/2013  . Allergic rhinitis 10/26/2013   Kerin Perna, PTA 05/10/20 9:29 AM  [place], [place], [address] Phone: [phone]   Fax:  [phone]  Name: Melanie Hart MRN: [id number] Date of Birth: [birth year]

## 2020-05-17 ENCOUNTER — Encounter: Payer: 59 | Admitting: Physical Therapy

## 2020-05-24 ENCOUNTER — Ambulatory Visit (INDEPENDENT_AMBULATORY_CARE_PROVIDER_SITE_OTHER): Payer: 59 | Admitting: Rehabilitative and Restorative Service Providers"

## 2020-05-24 ENCOUNTER — Other Ambulatory Visit: Payer: Self-pay

## 2020-05-24 DIAGNOSIS — M25562 Pain in left knee: Secondary | ICD-10-CM

## 2020-05-24 DIAGNOSIS — M6281 Muscle weakness (generalized): Secondary | ICD-10-CM

## 2020-05-24 DIAGNOSIS — R29898 Other symptoms and signs involving the musculoskeletal system: Secondary | ICD-10-CM | POA: Diagnosis not present

## 2020-05-24 NOTE — Therapy (Addendum)
[place], [place], [address] Phone: [phone]   Fax:  [phone]  Physical Therapy Treatment and Discharge Summary  Patient Details  Name: Melanie Hart MRN: [id number] Date of Birth: 21-Dec-1960 Referring Provider (PT): Iran Planas, [place]   Encounter Date: 05/24/2020   PT End of Session - 05/24/20 0851    Visit Number 5    Number of Visits 12    Date for PT Re-Evaluation 06/07/20    Authorization Type UMR    PT Start Time 0848    PT Stop Time 0932    PT Time Calculation (min) 44 min    Activity Tolerance Patient tolerated treatment well    Behavior During Therapy [facility] for tasks assessed/performed           Past Medical History:  Diagnosis Date  . Allergy    seasonal  . Arthritis    hips,hands  . Hemorrhage of colon following colonoscopy 02/27/2019  . Hypertension    not currently on meds    Past Surgical History:  Procedure Laterality Date  . COLONOSCOPY    . FLEXIBLE SIGMOIDOSCOPY N/A 02/27/2019   Procedure: FLEXIBLE SIGMOIDOSCOPY;  Surgeon: Gatha Mayer, MD;  Location: [facility] ENDOSCOPY;  Service: Endoscopy;  Laterality: N/A;  . HEMOSTASIS CLIP PLACEMENT  02/27/2019   Procedure: HEMOSTASIS CLIP PLACEMENT;  Surgeon: Gatha Mayer, MD;  Location: [place];  Service: Endoscopy;;  . INCONTINENCE SURGERY    . wisdom teeth      There were no vitals filed for this visit.   Subjective Assessment - 05/24/20 0849    Subjective The patient reports the past 2 weeks have been steady.  "It has improved a lot, but still happens."  She gets stiffness and pain in the morning and after sitting for longer periods.  She awoke with 4/10 pain today.  The patient also notes she stopped mobic.    Pertinent History h/o muscle tear R quad, h/o wearing orthopedic shoes as a child L ankle    Patient Stated Goals reduce pain after sitting    Currently in Pain? No/denies              [facility] PT  Assessment - 05/24/20 0854      Assessment   Medical Diagnosis L knee pain    Referring Provider (PT) Iran Planas, PA-C    Onset Date/Surgical Date 04/07/20    Hand Dominance Right      AROM   Left Knee Flexion 135      Strength   Overall Strength Deficits    Right Hip ABduction 5/5    Left Hip ABduction 4+/5    Left Knee Flexion 5/5    Right Ankle Dorsiflexion 5/5    Right Ankle Plantar Flexion 5/5    Left Ankle Dorsiflexion 4+/5    Left Ankle Plantar Flexion 5/5                         [facility] Adult PT Treatment/Exercise - 05/24/20 0855      Exercises   Exercises Knee/Hip      Knee/Hip Exercises: Stretches   Active Hamstring Stretch Right;Left;1 rep;30 seconds    Piriformis Stretch Right;Left;2 reps;20 seconds    Gastroc Stretch Left;1 rep;30 seconds      Knee/Hip Exercises: Standing   Heel Raises Right;Left;20 reps    Hip Abduction Stengthening;Right;Left;15 reps    Abduction Limitations X band walk x 15 side  steps R and L with green band    Lateral Step Up Left;10 reps    Lateral Step Up Limitations 6" surface    SLS L and R SLS for the diver reaching to chair height surface.      Manual Therapy   Manual Therapy Soft tissue mobilization    Manual therapy comments skilled palpation to assess response to dry needling    Soft tissue mobilization STM for L lateral distal hamstring, vastus lateralis            Trigger Point Dry Needling - 05/24/20 0918    Consent Given? Yes    Education Handout Provided Previously provided    Muscles Treated Lower Quadrant Hamstring;Vastus lateralis;Gastrocnemius;Quadriceps    Dry Needling Comments L side    Quadriceps Response Twitch response elicited;Palpable increased muscle length    Vastus lateralis Response Twitch response elicited;Palpable increased muscle length    Hamstring Response Twitch response elicited;Palpable increased muscle length    Gastrocnemius Response Palpable increased muscle length                      PT Long Term Goals - 05/24/20 0853      PT LONG TERM GOAL #1   Title The patient will be indep with HEP.    Time 6    Period Weeks    Status On-going    Target Date 06/07/20      PT LONG TERM GOAL #2   Title The patient will improve FOTO to < or equal to 16% limitation.    Baseline 19%    Time 6    Period Weeks    Status On-going      PT LONG TERM GOAL #3   Title The patient will report pain dec'd at night to no night waking.    Time 6    Period Weeks    Status Partially Met      PT LONG TERM GOAL #4   Title The patient will demonstrate end range knee flexion L knee without pain.    Baseline met on 05/24/20    Time 6    Period Weeks    Status Achieved      PT LONG TERM GOAL #5   Title The patient will improve L hip strength to 5/5.    Time 6    Period Weeks    Status On-going                 Plan - 05/24/20 1511    Clinical Impression Statement The patient notes increased discomfort after completing mobic, but still notes some improvement with therapy.  We progressed HEP today adding further strengthening and modifying stretching program.  Plan to continue working to [place].    Examination-Activity Limitations Locomotion Level;Sleep;Squat;Stand    Examination-Participation Restrictions Occupation    Stability/Clinical Decision Making Stable/Uncomplicated    Rehab Potential Good    PT Frequency 2x / week    PT Duration 6 weeks    PT Treatment/Interventions ADLs/Self Care Home Management;Gait training;Stair training;Functional mobility training;Therapeutic activities;Therapeutic exercise;Electrical Stimulation;Iontophoresis 17m/ml Dexamethasone;Cryotherapy;Moist Heat;Manual techniques;Taping;Dry needling;Patient/family education    PT Next Visit Plan progress VMO strengthening, work on standing glut med strengthening, STM/DN for IT Band/quad/HS.  Assess need to retape Lt knee for patellar taping.    PT Home Exercise Plan Access Code:  [email or web address]    Consulted and Agree with Plan of Care Patient           Patient will  benefit from skilled therapeutic intervention in order to improve the following deficits and impairments:  Pain,Hypermobility,Impaired flexibility,Decreased strength,Increased fascial restricitons  Visit Diagnosis: Acute pain of left knee  Muscle weakness (generalized)  Other symptoms and signs involving the musculoskeletal system     Problem List Patient Active Problem List   Diagnosis Date Noted  . Numbness 11/19/2019  . Abnormal MRI of head 10/20/2019  . Headache 09/25/2019  . Systolic murmur 03/29/1116  . Hemorrhage of colon following colonoscopy 02/27/2019  . Adenomatous colon polyp 10/17/2018  . Leg pain, right 09/24/2017  . Muscle cramps 09/24/2017  . Incontinence of feces with fecal urgency 09/24/2017  . Paresthesia 08/08/2017  . Vitamin D deficiency 08/08/2017  . Seborrheic keratosis 08/08/2017  . Left hip pain 06/12/2016  . Dyslipidemia 06/11/2016  . Cystocele 08/22/2015  . Absence of bladder continence 08/22/2015  . Hematuria 08/22/2015  . Vaginal dryness, menopausal 08/22/2015  . QT prolongation 01/28/2015  . Double vision 01/24/2015  . Hypertriglyceridemia 05/02/2014  . Overweight (BMI 25.0-29.9) 02/19/2014  . Depression 10/26/2013  . Stress at home 10/26/2013  . Benign essential hypertension 10/26/2013  . Allergic rhinitis 10/26/2013   PHYSICAL THERAPY DISCHARGE SUMMARY  Visits from Start of Care: 4  Current functional level related to goals / functional outcomes: See above-- partially met.   Remaining deficits: Last known status documented above.   Education / Equipment: HEP  Plan: Patient agrees to discharge.  Patient goals were partially met. Patient is being discharged due to meeting the stated rehab goals.  ?????         Thank you for the referral of this patient. Rudell Cobb, MPT   [place], [place] 05/24/2020, 3:18 PM  [facility] [place], [place], [address] Phone: [phone]   Fax:  [phone]  Name: Melanie Hart MRN: [id number] Date of Birth: 25-Jun-1960

## 2020-05-24 NOTE — Patient Instructions (Signed)
Access Code: [email or web address] URL: [email or web address] Date: 05/24/2020 Prepared by: Margretta Ditty  Exercises Seated Hamstring Stretch with Chair - 2 x daily - 7 x weekly - 1 sets - 3 reps - 30-45 seconds hold Seated Table Piriformis Stretch - 1 x daily - 7 x weekly - 1 sets - 2-3 reps - 15-30 sec hold Seated Hip Flexor Stretch - 2 x daily - 7 x weekly - 1 sets - 3 reps - 30 seconds hold Standing ITB Stretch - 2 x daily - 7 x weekly - 1 sets - 2 reps - 30 seconds hold X Band Walk - 2 x daily - 7 x weekly - 1 sets - 10 reps The Diver - 2 x daily - 7 x weekly - 1 sets - 5-10 reps Lateral Step Down - 2 x daily - 7 x weekly - 1 sets - 10 reps

## 2020-05-31 ENCOUNTER — Encounter: Payer: 59 | Admitting: Physical Therapy

## 2020-06-07 ENCOUNTER — Encounter: Payer: 59 | Admitting: Physical Therapy

## 2020-06-17 MED FILL — MELOXICAM 15 MG TABLET: 15 | 30 days supply | Qty: 30 | Fill #1

## 2020-10-03 ENCOUNTER — Telehealth: Payer: Self-pay

## 2020-10-03 DIAGNOSIS — H532 Diplopia: Secondary | ICD-10-CM

## 2020-10-03 DIAGNOSIS — R930 Abnormal findings on diagnostic imaging of skull and head, not elsewhere classified: Secondary | ICD-10-CM

## 2020-10-03 DIAGNOSIS — R200 Anesthesia of skin: Secondary | ICD-10-CM

## 2020-10-03 DIAGNOSIS — R519 Headache, unspecified: Secondary | ICD-10-CM

## 2020-10-03 NOTE — Telephone Encounter (Signed)
Patient left a voicemail this morning asking for an MRI order prior to her appointment with Dr. Felecia Shelling.  Please call.

## 2020-10-03 NOTE — Addendum Note (Signed)
Addended by: Wyvonnia Lora on: 10/03/2020 02:06 PM   Modules accepted: Orders

## 2020-10-03 NOTE — Telephone Encounter (Signed)
Per Dr. Felecia Shelling, ok to place order for MRI brain w/wo

## 2020-10-03 NOTE — Telephone Encounter (Signed)
Pt called back and spoke with Lattie Haw in phone room. Agreeable to go to [facility] for MRI. I placed order.

## 2020-10-03 NOTE — Telephone Encounter (Signed)
LVM returning pt call. I reviewed her chart. She was last seen 11/19/19. Dr. Felecia Shelling did mention ordering another MRI in about a year. Wanted to check w/ pt to see if she had a preferred imaging place or if [facility] was ok.

## 2020-10-04 ENCOUNTER — Telehealth: Payer: Self-pay | Admitting: Neurology

## 2020-10-04 NOTE — Telephone Encounter (Signed)
cone umr order sent to GI. No auth they will reach out to the patient to schedule.  

## 2020-10-11 ENCOUNTER — Other Ambulatory Visit: Payer: Self-pay

## 2020-10-11 ENCOUNTER — Ambulatory Visit
Admission: RE | Admit: 2020-10-11 | Discharge: 2020-10-11 | Disposition: A | Discharge status: Home / Self Care | Payer: 59 | Source: Ambulatory Visit | Attending: Neurology | Admitting: Neurology

## 2020-10-11 DIAGNOSIS — R200 Anesthesia of skin: Secondary | ICD-10-CM

## 2020-10-11 DIAGNOSIS — R930 Abnormal findings on diagnostic imaging of skull and head, not elsewhere classified: Secondary | ICD-10-CM

## 2020-10-11 DIAGNOSIS — H532 Diplopia: Secondary | ICD-10-CM

## 2020-10-11 DIAGNOSIS — R519 Headache, unspecified: Secondary | ICD-10-CM

## 2020-10-11 MED ORDER — GADOBENATE DIMEGLUMINE 529 MG/ML IV SOLN
14.0000 mL | Freq: Once | INTRAVENOUS | Status: AC | PRN
  Administered 2020-10-11: 14 mL via INTRAVENOUS

## 2020-11-18 ENCOUNTER — Other Ambulatory Visit: Payer: 59

## 2020-11-22 ENCOUNTER — Other Ambulatory Visit: Payer: Self-pay

## 2020-11-22 ENCOUNTER — Encounter: Payer: Self-pay | Admitting: Neurology

## 2020-11-22 ENCOUNTER — Other Ambulatory Visit (HOSPITAL_BASED_OUTPATIENT_CLINIC_OR_DEPARTMENT_OTHER): Payer: Self-pay

## 2020-11-22 ENCOUNTER — Ambulatory Visit: Payer: 59 | Admitting: Neurology

## 2020-11-22 VITALS — BP 178/87 | HR 67 | Ht 62.0 in | Wt 160.5 lb

## 2020-11-22 DIAGNOSIS — H532 Diplopia: Secondary | ICD-10-CM | POA: Diagnosis not present

## 2020-11-22 DIAGNOSIS — M5432 Sciatica, left side: Secondary | ICD-10-CM | POA: Diagnosis not present

## 2020-11-22 DIAGNOSIS — R930 Abnormal findings on diagnostic imaging of skull and head, not elsewhere classified: Secondary | ICD-10-CM | POA: Diagnosis not present

## 2020-11-22 DIAGNOSIS — G43109 Migraine with aura, not intractable, without status migrainosus: Secondary | ICD-10-CM

## 2020-11-22 MED ORDER — CYCLOBENZAPRINE HCL 5 MG PO TABS
5.0000 mg | ORAL_TABLET | Freq: Three times a day (TID) | ORAL | 2 refills | Status: AC | PRN
  Filled 2020-11-22 – 2020-12-12 (×2): qty 15, 5d supply, fill #0
  Filled 2021-07-24: qty 15, 5d supply, fill #1

## 2020-11-22 MED ORDER — METHYLPREDNISOLONE 4 MG PO TBPK
ORAL_TABLET | ORAL | 0 refills | Status: DC
  Filled 2020-11-22 – 2020-12-12 (×2): qty 21, 6d supply, fill #0

## 2020-11-22 NOTE — Progress Notes (Signed)
[facility]  PATIENT: Melanie Hart DOB: [birth year]  REFERRING DOCTOR OR PCP: Iran Planas PA-C SOURCE: Patient, notes from primary care, laboratory reports, imaging reports, MRI images personally reviewed.  _________________________________   HISTORICAL  CHIEF COMPLAINT:  Chief Complaint  Patient presents with   Follow-up    RM 13, alone. Last seen 11/19/2019. MRI completed 10/11/20 okay. Gets ocassional migraines about once every 3-5 months. Takes excedrin migraine prn which helps. She is wanting to discuss sciatic pain that is mostly on right side. Also has it in the left. Taking three week trip to [place] soon and worried about sitting on plane for a long time. This pain has been present for years. Worse in last 6 months. Radiates up her back.     HISTORY OF PRESENT ILLNESS:  Melanie Hart,is a 60 year old woman with diplopia, headaches and numbness and an abnormal brain MRI.  Since the last visit, she reports no major issues.  She will have migraines a couple times a year -- but she used to have more in her twenties and thirties.   When a HA occurs, she takes ibuprofen or Excedrin Migraine with benefit .   When she has a migraine, she gets visual aura.   She will rarely have an aura without a migraine.   She will have photophobia, phonophobia but not nausea.     Sleep helps the most to reduce pain.  She has had diplopia x 4 years. Vision is down and to the right.  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   It occurs unilaterally with either eye or with both eyes and is worse with reading.     She has numbness in her right foot over the last year..   She has had right carpal tunnel syndrome which has done foot.    She woke up a few nights with excruciating right thigh pain - like a charley horse that didn't go away.   She was told she tore a muscle but pain occurred when she   This has improved  after dry needling.    She has had sciatica on the right.  This was exacerbated with a plane ride to [place].   She will be doing loger trip to [place] later this summer.   She sometimes has aching thigh pain at night.    Vascular risk:   She has HTN (mild).  She never smoked and does not have DM.  She was once told she had scleroderma in the right arm.   She sleeps well at night.   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      IMAGING: MRI of the brain 10/11/20 shows scattered T2/FLAIR hyperintense foci predominantly in the subcortical and deep white matter.  None of the foci appear to be acute and they do not enhance.  I discussed with her that the pattern is most typical for chronic microvascular ischemic change or sequelae of migraine.   No change compared to 09/2019    REVIEW OF SYSTEMS: Constitutional: No fevers, chills, sweats, or change in appetite Eyes: No visual changes, double vision, eye pain Ear, nose and throat: No hearing loss, ear pain, nasal congestion, sore throat Cardiovascular: No chest pain, palpitations Respiratory:  No shortness of breath at rest or with exertion.   No wheezes GastrointestinaI: No nausea, vomiting, diarrhea, abdominal pain, fecal incontinence Genitourinary:  No dysuria, urinary retention or frequency.  No nocturia. Musculoskeletal:  No neck pain, back pain Integumentary: No rash, pruritus, skin lesions Neurological: as above Psychiatric: No depression at this time.  No anxiety Endocrine: No palpitations, diaphoresis, change in appetite, change in weigh or increased thirst Hematologic/Lymphatic:  No anemia, purpura, petechiae. Allergic/Immunologic: She has seasonal allergies.  ALLERGIES: Allergies  Allergen Reactions   Contrave [Naltrexone-Bupropion Hcl Er]     Dry mouth/constipation   Lisinopril     Did not like the way it made her feel.     HOME MEDICATIONS:  Current Outpatient Medications:    CALCIUM-MAGNESUIUM-ZINC 333-133-8.3 MG TABS, , Disp: ,  Rfl:    Coenzyme Q10 (CO Q-10) 400 MG CAPS, , Disp: , Rfl:    fexofenadine (ALLEGRA) 180 MG tablet, TAKE 1 TABLET (180 MG TOTAL) BY MOUTH DAILY., Disp: 90 tablet, Rfl: 3   Omega-3 Fatty Acids (FISH OIL PO), Take by mouth., Disp: , Rfl:    triamcinolone (NASACORT) 55 MCG/ACT AERO nasal inhaler, Place 2 sprays into the nose daily., Disp: , Rfl:   PAST MEDICAL HISTORY: Past Medical History:  Diagnosis Date   Allergy    seasonal   Arthritis    hips,hands   Hemorrhage of colon following colonoscopy 02/27/2019   Hypertension    not currently on meds    PAST SURGICAL HISTORY: Past Surgical History:  Procedure Laterality Date   COLONOSCOPY     FLEXIBLE SIGMOIDOSCOPY N/A 02/27/2019   Procedure: FLEXIBLE SIGMOIDOSCOPY;  Surgeon: Gatha Mayer, MD;  Location[facility] ENDOSCOPY;  Service: Endoscopy;  Laterality: N/A;   HEMOSTASIS CLIP PLACEMENT  02/27/2019   Procedure: HEMOSTASIS CLIP PLACEMENT;  Surgeon: Gatha Mayer, MD;  Location: [facility] ENDOSCOPY;  Service: Endoscopy;;   INCONTINENCE SURGERY     wisdom teeth      FAMILY HISTORY: Family History  Problem Relation Age of Onset   Hypertension Mother    Colon polyps Mother 59       pre-cancer polyps   Hypertension Sister    Hyperlipidemia Brother    Hypertension Brother    Esophageal cancer Neg Hx    Rectal cancer Neg Hx    Stomach cancer Neg Hx    Colon cancer Neg Hx     SOCIAL HISTORY:  Social History   Socioeconomic History   Marital status: Married    Spouse name: Era Parr    Number of children: 2   Years of education: Not on file   Highest education level: Not on file  Occupational History   Occupation: Neurosurgeon Neurology    Employer: [employer]  Tobacco Use   Smoking status: Never   Smokeless tobacco: Never  Vaping Use   Vaping Use: Never used  Substance and Sexual Activity   Alcohol use: Not Currently   Drug use: No   Sexual activity: Yes  Other Topics Concern   Not on file  Social History Narrative   Lives  with husband and daughter   Caffeine use: coffee (about 4-5 cups per day)   Right handed    Married   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   General: The patient is well-developed and well-nourished and in no acute distress  HEENT:  Head is [place]/AT.  Sclera are anicteric.    Skin: Extremities are without rash or  edema.  Neurologic Exam  Mental status: The patient is alert and oriented x 3 at the time of the examination. The patient has apparent normal recent and remote memory, with an apparently normal attention span and concentration ability.   Speech is normal.  Cranial nerves: Extraocular movements are full. Pupils are equal, round, and reactive to light and accomodation.  Facial strength and sensation was normal.  No obvious hearing deficits are noted.  Motor:  Muscle bulk is normal.   Tone is normal. Strength is  5 / 5 in all 4 extremities.  Functional testing shows that she can squat and rise without difficulty.  She does not need to use her hands to get out of a chair.  She can stand on her heels and toes.  Sensory: Sensory testing is intact to pinprick, soft touch and vibration sensation in all 4 extremities.  Coordination: Cerebellar testing reveals good finger-nose-finger and heel-to-shin bilaterally.  Gait and station: Station is normal.   Gait is normal. Tandem gait is normal. Romberg is negative.   Reflexes: Deep tendon reflexes are symmetric and normal bilaterally.      DIAGNOSTIC DATA (LABS, IMAGING, TESTING) - I reviewed patient  records, labs, notes, testing and imaging myself where available.  Lab Results  Component Value Date   WBC 5.6 08/02/2017   HGB 13.6 08/02/2017   HCT 38.5 08/02/2017   MCV 83.9 08/02/2017   PLT 257 08/02/2017      Component Value Date/Time   NA 141 09/25/2019 0917   K 4.3 09/25/2019 0917   CL 106 09/25/2019 0917   CO2 25 09/25/2019 0917   GLUCOSE 91 09/25/2019 0917   BUN 16 09/25/2019 0917   CREATININE 0.70 09/25/2019 0917   CALCIUM 10.1 09/25/2019 0917   PROT 7.4 09/24/2017 1551   ALBUMIN 4.1 06/07/2016 0858   AST 16 09/24/2017 1551   ALT 18 09/24/2017 1551   ALKPHOS 82 06/07/2016 0858   BILITOT 0.5 09/24/2017 1551   GFRNONAA 97 09/25/2019 0917   GFRAA 112 09/25/2019 0917   Lab Results  Component Value Date   CHOL 244 (H) 08/02/2017   HDL 43 (L) 08/02/2017   LDLCALC 167 (H) 08/02/2017   TRIG 183 (H) 08/02/2017   CHOLHDL 5.7 (H) 08/02/2017   No results found for: HGBA1C Lab Results  Component Value Date   VITAMINB12 571 09/24/2017   Lab Results  Component Value Date   TSH 1.32 08/02/2017       ASSESSMENT AND PLAN  Abnormal MRI of head  Migraine with aura and without status migrainosus, not intractable  Double vision  Left sided sciatica   1.   MRI brain showed no changes compared to 2021.   Likely chronic microvascular ischemic change or sequelae of migrine.    2.   For sciatica, can take ibuprofen or flexeril prn or a medrol pack if it acts up (on trip).   3.   Stay active.    4.   Rtc prn if new or worsening symptoms.    Sarabelle Genson A. Felecia Shelling, MD, [facility] 01/19/2352, 6:14 PM Certified in Neurology, Clinical Neurophysiology, Sleep Medicine and Neuroimaging  [facility] [address]  [place], [place] [address] [phone]

## 2020-11-29 ENCOUNTER — Other Ambulatory Visit (HOSPITAL_BASED_OUTPATIENT_CLINIC_OR_DEPARTMENT_OTHER): Payer: Self-pay

## 2020-12-12 ENCOUNTER — Other Ambulatory Visit (HOSPITAL_BASED_OUTPATIENT_CLINIC_OR_DEPARTMENT_OTHER): Payer: Self-pay

## 2021-05-10 ENCOUNTER — Other Ambulatory Visit: Payer: Self-pay

## 2021-05-10 ENCOUNTER — Other Ambulatory Visit (HOSPITAL_BASED_OUTPATIENT_CLINIC_OR_DEPARTMENT_OTHER): Payer: Self-pay

## 2021-05-10 ENCOUNTER — Ambulatory Visit: Payer: 59 | Admitting: Physician Assistant

## 2021-05-10 ENCOUNTER — Encounter: Payer: Self-pay | Admitting: Physician Assistant

## 2021-05-10 VITALS — BP 184/98 | HR 64 | Ht 62.0 in | Wt 162.0 lb

## 2021-05-10 DIAGNOSIS — I10 Essential (primary) hypertension: Secondary | ICD-10-CM | POA: Diagnosis not present

## 2021-05-10 DIAGNOSIS — R030 Elevated blood-pressure reading, without diagnosis of hypertension: Secondary | ICD-10-CM

## 2021-05-10 DIAGNOSIS — R0981 Nasal congestion: Secondary | ICD-10-CM

## 2021-05-10 LAB — POCT URINALYSIS DIP (CLINITEK)
Bilirubin, UA: NEGATIVE
Blood, UA: NEGATIVE
Glucose, UA: NEGATIVE mg/dL
Ketones, POC UA: NEGATIVE mg/dL
Leukocytes, UA: NEGATIVE
Nitrite, UA: NEGATIVE
POC PROTEIN,UA: NEGATIVE
Spec Grav, UA: 1.02 (ref 1.010–1.025)
Urobilinogen, UA: 0.2 E.U./dL
pH, UA: 6.5 (ref 5.0–8.0)

## 2021-05-10 LAB — POCT UA - MICROALBUMIN
Albumin/Creatinine Ratio, Urine, POC: <30
Creatinine, POC: 200 mg/dL
Microalbumin Ur, POC: 10 mg/L

## 2021-05-10 MED ORDER — LOSARTAN POTASSIUM 25 MG PO TABS
25.0000 mg | ORAL_TABLET | Freq: Every day | ORAL | 0 refills | Status: DC
  Filled 2021-05-10: qty 30, 30d supply, fill #0

## 2021-05-10 NOTE — Progress Notes (Signed)
Subjective:    Patient ID: Melanie Hart, female    DOB: 08-23-1960, 60 y.o.   MRN: [id number]  HPI Pt is a 60 yo female who presents to the clinic to discuss nasal congestion. She wants to make sure she is not getting sick before she goes out of the country. She denies any fever, chills, body aches, SOB, cough, ear pain. Not tried anything to make better.     .. Active Ambulatory Problems    Diagnosis Date Noted   Depression 10/26/2013   Stress at home 10/26/2013   Benign essential hypertension 10/26/2013   Allergic rhinitis 10/26/2013   Overweight (BMI 25.0-29.9) 02/19/2014   Hypertriglyceridemia 05/02/2014   Double vision 01/24/2015   QT prolongation 01/28/2015   Cystocele 08/22/2015   Absence of bladder continence 08/22/2015   Hematuria 08/22/2015   Vaginal dryness, menopausal 08/22/2015   Dyslipidemia 06/11/2016   Left hip pain 06/12/2016   Paresthesia 08/08/2017   Vitamin D deficiency 08/08/2017   Seborrheic keratosis 08/08/2017   Leg pain, right 09/24/2017   Muscle cramps 09/24/2017   Incontinence of feces with fecal urgency 09/24/2017   Adenomatous colon polyp 10/17/2018   Hemorrhage of colon following colonoscopy 02/27/2019   Headache 18/56/3149   Systolic murmur 70/26/3785   Abnormal MRI of head 10/20/2019   Numbness 11/19/2019   Migraine with aura and without status migrainosus, not intractable 11/22/2020   Left sided sciatica 11/22/2020   Resolved Ambulatory Problems    Diagnosis Date Noted   No Resolved Ambulatory Problems   Past Medical History:  Diagnosis Date   Allergy    Arthritis    Hypertension       Review of Systems    See HPI.  Objective:   Physical Exam Vitals reviewed.  Constitutional:      Appearance: Normal appearance.  HENT:     Head: Normocephalic.  Neck:     Vascular: No carotid bruit.  Cardiovascular:     Rate and Rhythm: Normal rate and regular rhythm.     Pulses: Normal pulses.     Heart sounds: Murmur heard.   Musculoskeletal:     Right lower leg: No edema.     Left lower leg: No edema.  Lymphadenopathy:     Cervical: No cervical adenopathy.  Neurological:     General: No focal deficit present.     Mental Status: She is alert and oriented to person, place, and time.  Psychiatric:        Mood and Affect: Mood normal.    .. Results for orders placed or performed in visit on 05/10/21  POCT URINALYSIS DIP (CLINITEK)  Result Value Ref Range   Color, UA yellow yellow   Clarity, UA clear clear   Glucose, UA negative negative mg/dL   Bilirubin, UA negative negative   Ketones, POC UA negative negative mg/dL   Spec Grav, UA 1.020 1.010 - 1.025   Blood, UA negative negative   pH, UA 6.5 5.0 - 8.0   POC PROTEIN,UA negative negative, trace   Urobilinogen, UA 0.2 0.2 or 1.0 E.U./dL   Nitrite, UA Negative Negative   Leukocytes, UA Negative Negative  POCT UA - Microalbumin  Result Value Ref Range   Microalbumin Ur, POC 10 mg/L   Creatinine, POC 200 mg/dL   Albumin/Creatinine Ratio, Urine, POC <30          Assessment & Plan:  Marland KitchenMarland KitchenAngala was seen today for nasal congestion.  Diagnoses and all orders for this visit:  Benign essential hypertension  Elevated blood pressure reading -     EKG 12-Lead -     POCT URINALYSIS DIP (CLINITEK) -     POCT UA - Microalbumin  Other orders -     Discontinue: losartan (COZAAR) 25 MG tablet; Take 1 tablet (25 mg total) by mouth daily.   BP very elevated today. Pt asymptomatic.  EKG NSR and no acute changes. Start cozaar 25mg  daily.  Discussed side effects.  Follow up in 1 week.  Keep BP log at home.  Discussed low salt diet and exercise.  No protein in urine.  For nasal congestion use flonase daily. If symptoms progress or change follow up.

## 2021-05-16 ENCOUNTER — Other Ambulatory Visit (HOSPITAL_BASED_OUTPATIENT_CLINIC_OR_DEPARTMENT_OTHER): Payer: Self-pay

## 2021-05-16 ENCOUNTER — Ambulatory Visit (INDEPENDENT_AMBULATORY_CARE_PROVIDER_SITE_OTHER): Payer: 59 | Admitting: Physician Assistant

## 2021-05-16 ENCOUNTER — Other Ambulatory Visit: Payer: Self-pay

## 2021-05-16 ENCOUNTER — Encounter: Payer: Self-pay | Admitting: Physician Assistant

## 2021-05-16 VITALS — BP 155/82 | HR 65

## 2021-05-16 DIAGNOSIS — I10 Essential (primary) hypertension: Secondary | ICD-10-CM | POA: Diagnosis not present

## 2021-05-16 MED ORDER — LOSARTAN POTASSIUM 50 MG PO TABS
50.0000 mg | ORAL_TABLET | Freq: Every day | ORAL | 0 refills | Status: DC
  Filled 2021-05-16: qty 30, 30d supply, fill #0

## 2021-05-16 NOTE — Progress Notes (Signed)
Patient ID: Melanie Hart, female   DOB: Nov 19, 1960, 60 y.o.   MRN: [id number]  BP much better than 1 week ago.  Increased cozaar to 50mg  and follow up in 3 weeks.  Continue to check BP.

## 2021-05-16 NOTE — Progress Notes (Signed)
First BP reading was 162/75.  After sitting for 10 min, 2nd reading was 155/82.  Pt states that she is getting readings of 130s-150s/70s-90s at home.  Per provider, pt was instructed to increase Losartan to 50mg  and to do another nurse visit bp check in 2 weeks. Pt is going to [place] for 3 weeks so she will schedule a nurse visit for when she is back in the country.  New rx for Losartan 50mg  sent in for pt for when she runs out of the 25mg  tabs.

## 2021-06-14 ENCOUNTER — Ambulatory Visit: Payer: 59 | Admitting: Physician Assistant

## 2021-06-14 ENCOUNTER — Other Ambulatory Visit (HOSPITAL_BASED_OUTPATIENT_CLINIC_OR_DEPARTMENT_OTHER): Payer: Self-pay

## 2021-06-14 ENCOUNTER — Other Ambulatory Visit: Payer: Self-pay

## 2021-06-14 VITALS — BP 136/72 | HR 80 | Temp 97.9°F

## 2021-06-14 DIAGNOSIS — I10 Essential (primary) hypertension: Secondary | ICD-10-CM

## 2021-06-14 DIAGNOSIS — H6983 Other specified disorders of Eustachian tube, bilateral: Secondary | ICD-10-CM | POA: Diagnosis not present

## 2021-06-14 MED ORDER — METHYLPREDNISOLONE SODIUM SUCC 125 MG IJ SOLR
125.0000 mg | Freq: Once | INTRAMUSCULAR | Status: AC
  Administered 2021-06-14: 125 mg via INTRAMUSCULAR

## 2021-06-14 MED ORDER — METHYLPREDNISOLONE SODIUM SUCC 125 MG IJ SOLR
125.0000 mg | Freq: Once | INTRAMUSCULAR | 0 refills | Status: DC
  Filled 2021-06-14: qty 1, 1d supply, fill #0

## 2021-06-14 NOTE — Patient Instructions (Signed)
Methylprednisolone Solution Injection What is this medication? METHYLPREDNISOLONE (meth ill pred NISS oh lone) treats many conditions such as asthma, allergic reactions, arthritis, inflammatory bowel diseases, adrenal, and blood or bone marrow disorders. It works by decreasing inflammation, slowing down an overactive immune system, or replacing cortisol normally made in the body. Cortisol is a hormone that plays an important role in how the body responds to stress, illness, and injury. It belongs to a group of medications called steroids. This medicine may be used for other purposes; ask your health care provider or pharmacist if you have questions. COMMON BRAND NAME(S): A-Methapred, Solu-Medrol What should I tell my care team before I take this medication? They need to know if you have any of these conditions: Cushing's syndrome Eye disease, vision problems Diabetes Glaucoma Heart disease High blood pressure Infection (especially a virus infection such as chickenpox, cold sores, or herpes) Liver disease Mental illness Myasthenia gravis Osteoporosis Recently received or scheduled to receive a vaccine Seizures Stomach or intestine problems Thyroid disease An unusual or allergic reaction to lactose, methylprednisolone, other medications, foods, dyes, or preservatives Pregnant or trying to get pregnant Breast-feeding How should I use this medication? This medication is for injection or infusion into a vein. It is also for injection into a muscle. It is given by your care team in a hospital or clinic setting. Talk to your care team about the use of this medication in children. While this medication may be prescribed for selected conditions, precautions do apply. Overdosage: If you think you have taken too much of this medicine contact a poison control center or emergency room at once. NOTE: This medicine is only for you. Do not share this medicine with others. What if I miss a dose? This  does not apply. What may interact with this medication? Do not take this medication with any of the following: Alefacept Echinacea Iopamidol Live virus vaccines Metyrapone Mifepristone This medication may also interact with the following: Amphotericin B Aspirin and aspirin-like medications Certain antibiotics like erythromycin, clarithromycin, troleandomycin Certain medications for diabetes Certain medications for fungal infection like ketoconazole Certain medications for seizures like carbamazepine, phenobarbital, phenytoin Certain medications that treat or prevent blood clots like warfarin Cyclosporine Digoxin Diuretics Female hormones, like estrogens and birth control pills Isoniazid NSAIDS, medications for pain and inflammation, like ibuprofen or naproxen Other medications for myasthenia gravis Rifampin Vaccines This list may not describe all possible interactions. Give your health care provider a list of all the medicines, herbs, non-prescription drugs, or dietary supplements you use. Also tell them if you smoke, drink alcohol, or use illegal drugs. Some items may interact with your medicine. What should I watch for while using this medication? Tell your care team if your symptoms do not start to get better or if they get worse. Do not stop taking except on your care team's advice. You may develop a severe reaction. Your care team will tell you how much medication to take. Your condition will be monitored carefully while you are receiving this medication. This medication may increase your risk of getting an infection. Tell your care team if you are around anyone with measles or chickenpox, or if you develop sores or blisters that do not heal properly. This medication may increase blood sugar. Ask your care team if changes in diet or medications are needed if you have diabetes. Tell your care team right away if you have any change in your eyesight. Using this medication for a  long time may increase your  risk of low bone mass. Talk to your care team about bone health. What side effects may I notice from receiving this medication? Side effects that you should report to your care team as soon as possible: Allergic reactions--skin rash, itching, hives, swelling of the face, lips, tongue, or throat Cushing syndrome--increased fat around the midsection, upper back, neck, or face, pink or purple stretch marks on the skin, thinning, fragile skin that easily bruises, unexpected hair growth High blood sugar (hyperglycemia)--increased thirst or amount of urine, unusual weakness or fatigue, blurry vision Increase in blood pressure Infection--fever, chills, cough, sore throat, wounds that don't heal, pain or trouble when passing urine, general feeling of discomfort or being unwell Low adrenal gland function--nausea, vomiting, loss of appetite, unusual weakness or fatigue, dizziness Mood and behavior changes--anxiety, nervousness, confusion, hallucinations, irritability, hostility, thoughts of suicide or self-harm, worsening mood, feelings of depression Stomach bleeding--bloody or black, tar-like stools, vomiting blood or brown material that looks like coffee grounds Swelling of the ankles, hands, or feet Side effects that usually do not require medical attention (report to your care team if they continue or are bothersome): Acne General discomfort and fatigue Headache Increase in appetite Nausea Trouble sleeping Weight gain This list may not describe all possible side effects. Call your doctor for medical advice about side effects. You may report side effects to FDA at 1-800-FDA-1088. Where should I keep my medication? This medication is given in a hospital or clinic and will not be stored at home. NOTE: This sheet is a summary. It may not cover all possible information. If you have questions about this medicine, talk to your doctor, pharmacist, or health care provider.  2022  Elsevier/Gold Standard (2020-08-09 00:00:00)

## 2021-06-14 NOTE — Progress Notes (Signed)
Patient presents today as a nurse visit for a blood pressure check.  Patient states she is taking her medication as prescribed without any side effects/adverse effects. Medication and allergy list reviewed with patient and the pharmacy has been verified.   HA: No Dizziness/lightheadedness: No Fever: No BA: No Weakness/Fatigue: No  Sinus pain/pressure: No  Runny nose: No  ST: No  ShOB: No  CP: No  Palps: No Abd pain: No Dysuria: No  N/V/C/D: No    Vital Signs at 3:25 PM Blood Pressure: 138/74 Pulse: 71 SpO2: 99%  Vital Signs at 3:37 PM Blood Pressure: 136/72 Pulse: 80 SpO2: 99%   Information shared with Iran Planas, PA-C who instructed me to tell pt to continue current treatment, continue to monitor BP at home and keep a log, and to schedule a f/u OV in 3 months.   Pt aware and verbalized understanding.    Pt states she has ongoing left ear pain. She recently went on a trip to [place] and said that while she was there she went to the ER for the pain. They wanted her to take steroids but she told them she could not because of her BP. Pt has been using Nasacort nasal spray and taking fexofenadine with minimal relief. She tried taking Coricidin and states that it did not help the sx at all.   Spoke with Iran Planas, PA-C who told me to tell the pt that she can try alternating nasal sprays, using Flonase daily for 3 days and then Afrin daily for 3 days, and then repeating until the sx have resolved. She said that if the pt would like to get a Solu-Medrol injection while she is here today that she can, and that it will not interfere with her BP. It opted to get the injection today.

## 2021-06-16 ENCOUNTER — Other Ambulatory Visit (HOSPITAL_BASED_OUTPATIENT_CLINIC_OR_DEPARTMENT_OTHER): Payer: Self-pay

## 2021-06-16 ENCOUNTER — Other Ambulatory Visit: Payer: Self-pay | Admitting: Physician Assistant

## 2021-06-16 MED ORDER — LOSARTAN POTASSIUM 50 MG PO TABS
50.0000 mg | ORAL_TABLET | Freq: Every day | ORAL | 0 refills | Status: DC
  Filled 2021-06-16: qty 30, 30d supply, fill #0

## 2021-06-16 NOTE — Progress Notes (Signed)
Agree with above plan. 

## 2021-06-19 ENCOUNTER — Encounter: Payer: Self-pay | Admitting: Physician Assistant

## 2021-06-19 DIAGNOSIS — I10 Essential (primary) hypertension: Secondary | ICD-10-CM

## 2021-06-19 DIAGNOSIS — E7849 Other hyperlipidemia: Secondary | ICD-10-CM

## 2021-06-19 DIAGNOSIS — Z0000 Encounter for general adult medical examination without abnormal findings: Secondary | ICD-10-CM

## 2021-07-24 ENCOUNTER — Other Ambulatory Visit: Payer: Self-pay | Admitting: Physician Assistant

## 2021-07-24 ENCOUNTER — Other Ambulatory Visit (HOSPITAL_BASED_OUTPATIENT_CLINIC_OR_DEPARTMENT_OTHER): Payer: Self-pay

## 2021-07-24 DIAGNOSIS — R519 Headache, unspecified: Secondary | ICD-10-CM

## 2021-07-24 MED ORDER — LOSARTAN POTASSIUM 50 MG PO TABS
50.0000 mg | ORAL_TABLET | Freq: Every day | ORAL | 0 refills | Status: DC
  Filled 2021-07-24: qty 90, 90d supply, fill #0

## 2021-07-24 MED ORDER — FEXOFENADINE HCL 180 MG PO TABS
ORAL_TABLET | ORAL | 0 refills | Status: AC
  Filled 2021-07-24: qty 90, 90d supply, fill #0

## 2021-07-26 ENCOUNTER — Other Ambulatory Visit (HOSPITAL_BASED_OUTPATIENT_CLINIC_OR_DEPARTMENT_OTHER): Payer: Self-pay

## 2021-07-26 MED ORDER — TRIAMCINOLONE ACETONIDE 55 MCG/ACT NA AERO
2.0000 | INHALATION_SPRAY | Freq: Every day | NASAL | 1 refills | Status: AC
  Filled 2021-07-26: qty 16.9, 30d supply, fill #0

## 2021-07-28 ENCOUNTER — Encounter: Payer: Self-pay | Admitting: Physician Assistant

## 2021-07-28 ENCOUNTER — Ambulatory Visit (INDEPENDENT_AMBULATORY_CARE_PROVIDER_SITE_OTHER): Payer: 59 | Admitting: Physician Assistant

## 2021-07-28 ENCOUNTER — Other Ambulatory Visit: Payer: Self-pay

## 2021-07-28 ENCOUNTER — Ambulatory Visit (INDEPENDENT_AMBULATORY_CARE_PROVIDER_SITE_OTHER): Payer: 59

## 2021-07-28 VITALS — BP 152/82 | HR 82 | Ht 62.0 in | Wt 159.0 lb

## 2021-07-28 DIAGNOSIS — R296 Repeated falls: Secondary | ICD-10-CM

## 2021-07-28 DIAGNOSIS — R29898 Other symptoms and signs involving the musculoskeletal system: Secondary | ICD-10-CM | POA: Diagnosis not present

## 2021-07-28 DIAGNOSIS — M545 Low back pain, unspecified: Secondary | ICD-10-CM

## 2021-07-28 DIAGNOSIS — G8929 Other chronic pain: Secondary | ICD-10-CM

## 2021-07-28 DIAGNOSIS — M5441 Lumbago with sciatica, right side: Secondary | ICD-10-CM | POA: Insufficient documentation

## 2021-07-28 IMAGING — DX DG LUMBAR SPINE COMPLETE 4+V
5 series · 5 of 5 positions shown · non-contrast
Comparison: [DATE]

CLINICAL DATA: Low back pain radiated down right leg.

EXAM:
LUMBAR SPINE - COMPLETE 4+ VIEW

[l-spine ap]
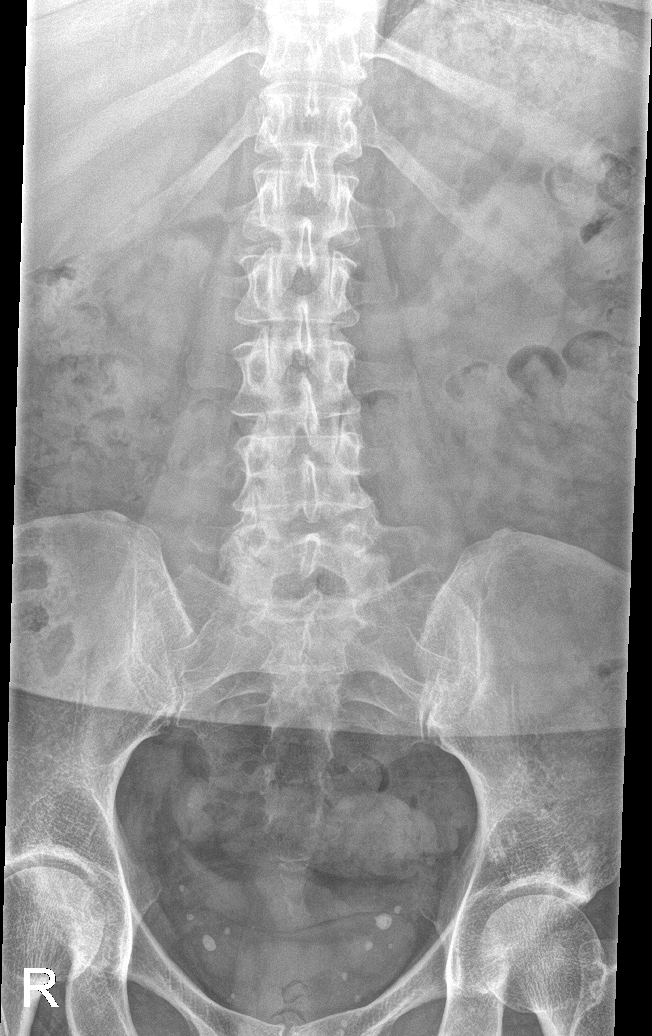

[l-spine obl (1 of 2)]
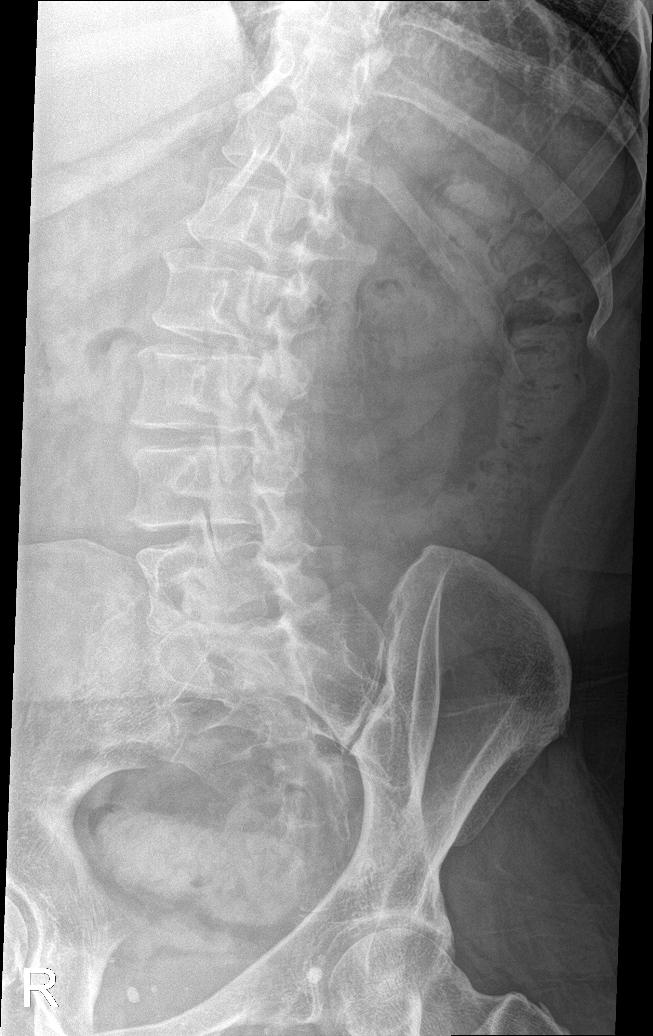

[l-spine obl (2 of 2)]
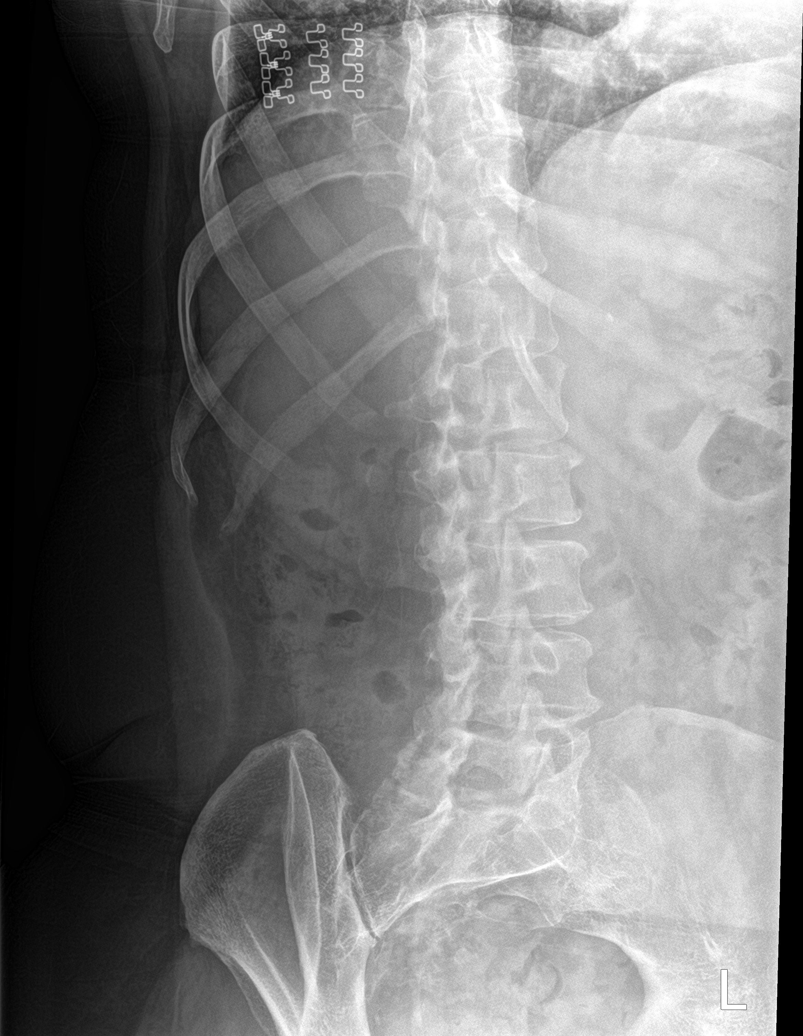

[l-spine lat]
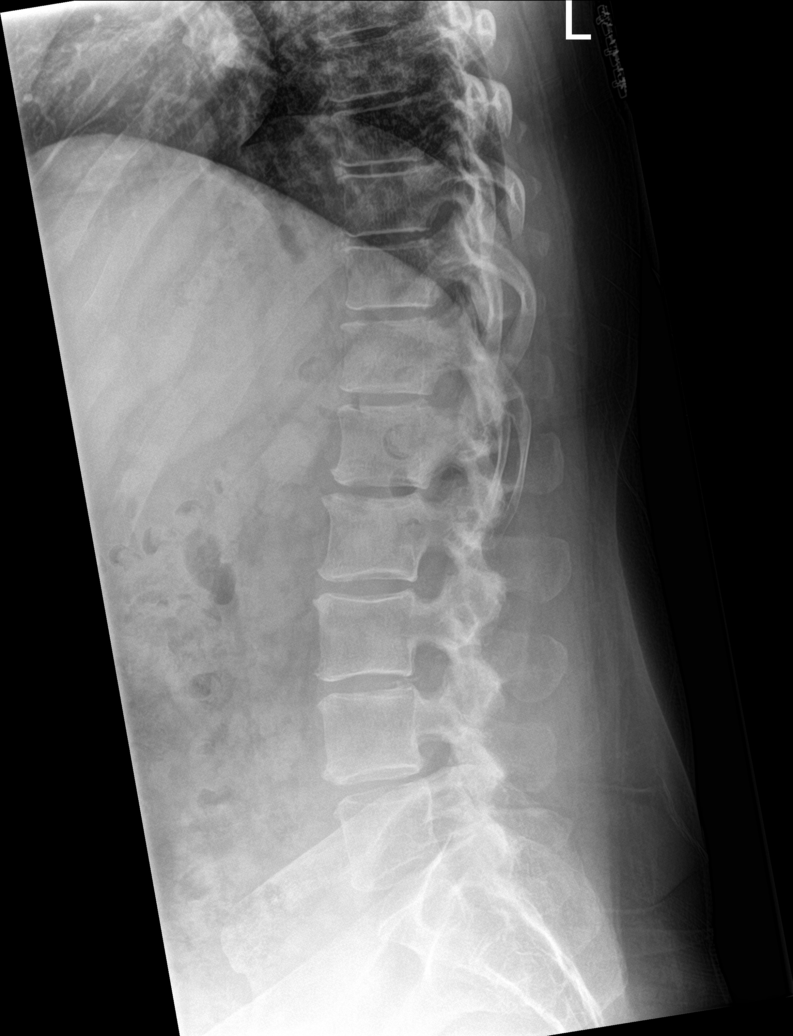

[l-spine spot]
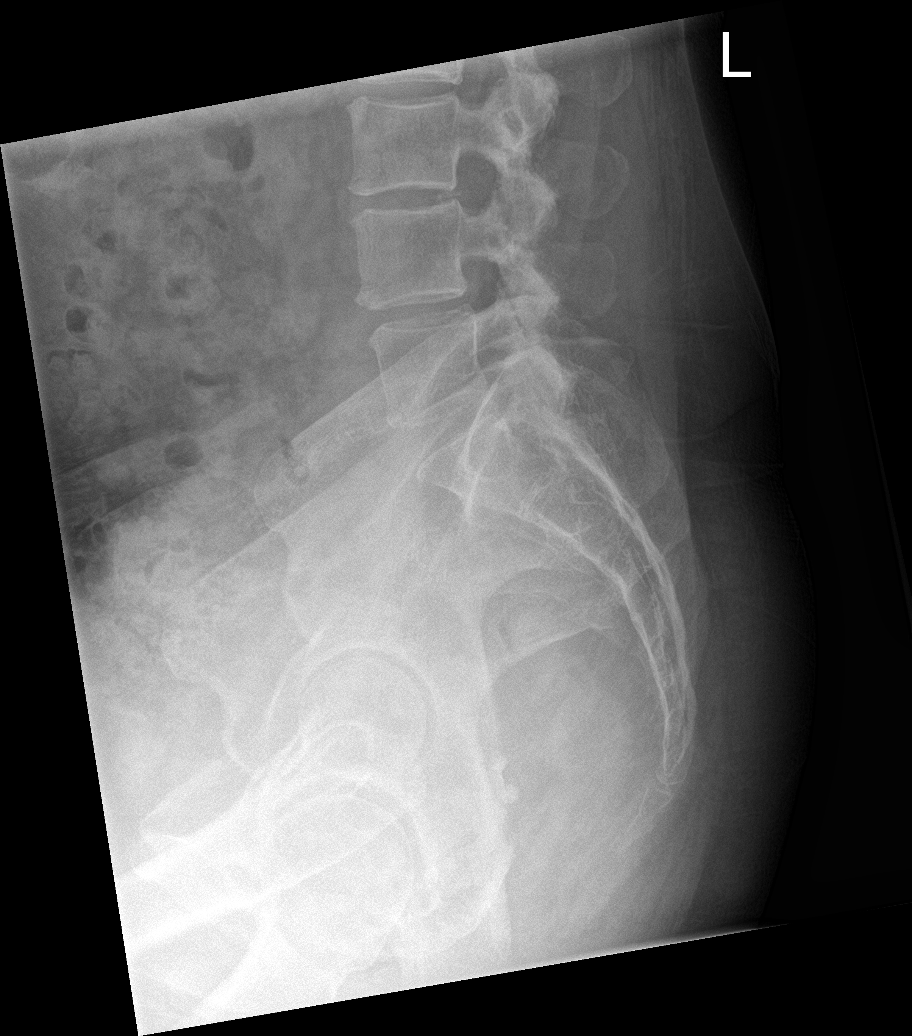

[5 of 5 positions shown; findings below may reference images not displayed]

FINDINGS: Early degenerative facet changes throughout the lumbar spine, most
pronounced at L4-5 and L5-S1. Slight disc space narrowing at L4-5.
Early anterior spurring. Normal alignment. No fracture. SI joints
symmetric and unremarkable.
IMPRESSION: Early degenerative changes.  No acute bony abnormality.

## 2021-07-28 NOTE — Progress Notes (Signed)
? ?Subjective:  ? ? Patient ID: Melanie Hart, female    DOB: [birth year], 61 y.o.   MRN: [id number] ? ?HPI ?Pt is a 61 yo female HTN, Migraines, lumbar DDD who presents to the clinic concerned about right leg weakness, right low back pain and falls. She feels like for the last few months she has been falling more. She feels like her legs just go out from under her. She has some radicular pain from time to time but stays in buttocks and hips and lateral leg. No knee pain. She has OAB but no loss of bowel or bladder control. No saddle anesthesia.  ?.. ?Active Ambulatory Problems  ?  Diagnosis Date Noted  ? Depression 10/26/2013  ? Stress at home 10/26/2013  ? Benign essential hypertension 10/26/2013  ? Allergic rhinitis 10/26/2013  ? Overweight (BMI 25.0-29.9) 02/19/2014  ? Hypertriglyceridemia 05/02/2014  ? Double vision 01/24/2015  ? QT prolongation 01/28/2015  ? Cystocele 08/22/2015  ? Absence of bladder continence 08/22/2015  ? Hematuria 08/22/2015  ? Vaginal dryness, menopausal 08/22/2015  ? Dyslipidemia 06/11/2016  ? Left hip pain 06/12/2016  ? Paresthesia 08/08/2017  ? Vitamin D deficiency 08/08/2017  ? Seborrheic keratosis 08/08/2017  ? Leg pain, right 09/24/2017  ? Muscle cramps 09/24/2017  ? Incontinence of feces with fecal urgency 09/24/2017  ? Adenomatous colon polyp 10/17/2018  ? Hemorrhage of colon following colonoscopy 02/27/2019  ? Headache 09/25/2019  ? Systolic murmur 14/48/1856  ? Abnormal MRI of head 10/20/2019  ? Numbness 11/19/2019  ? Migraine with aura and without status migrainosus, not intractable 11/22/2020  ? Left sided sciatica 11/22/2020  ? Frequent falls 07/28/2021  ? Right leg weakness 07/28/2021  ? Chronic right-sided low back pain with right-sided sciatica 07/28/2021  ? DDD (degenerative disc disease), lumbar 07/31/2021  ? ?Resolved Ambulatory Problems  ?  Diagnosis Date Noted  ? No Resolved Ambulatory Problems  ? ?Past Medical History:  ?Diagnosis Date  ? Allergy   ? Arthritis   ?  Hypertension   ? ? ? ?Review of Systems ?See HPI.  ?   ?Objective:  ? Physical Exam ?Vitals reviewed.  ?Constitutional:   ?   Appearance: Normal appearance. She is obese.  ?HENT:  ?   Head: Normocephalic.  ?Cardiovascular:  ?   Rate and Rhythm: Normal rate.  ?   Pulses: Normal pulses.  ?   Heart sounds: Normal heart sounds.  ?Pulmonary:  ?   Effort: Pulmonary effort is normal.  ?   Breath sounds: Normal breath sounds.  ?Musculoskeletal:  ?   Right lower leg: No edema.  ?   Left lower leg: No edema.  ?   Comments: 5/5 strength bilateral lower legs ?NROM of lower extermities. ?NROM at waist ?Negative straight leg ?Patellar tendon reflexes 2 + and symmetric  ?Neurological:  ?   General: No focal deficit present.  ?   Mental Status: She is alert and oriented to person, place, and time.  ?Psychiatric:     ?   Mood and Affect: Mood normal.  ? ? ? ? ? ?   ?Assessment & Plan:  ?..Melanie Hart was seen today for leg pain. ? ?Diagnoses and all orders for this visit: ? ?Chronic right-sided low back pain without sciatica ?-     DG Lumbar Spine Complete; Future ? ?Right leg weakness ?-     DG Lumbar Spine Complete; Future ?-     Ambulatory referral to Neurology ? ?Frequent falls ?-  Ambulatory referral to Neurology ? ? ?No red flags on exam but she has been falling and reports right leg weakness.  ?Will refer to neurology. ?Will get lumbar xray today.  ?Consider follow up with Dr. Darene Lamer if degenerative changes on lumbar xray. ? ?

## 2021-07-31 ENCOUNTER — Encounter: Payer: Self-pay | Admitting: Physician Assistant

## 2021-07-31 ENCOUNTER — Telehealth: Payer: Self-pay

## 2021-07-31 DIAGNOSIS — M5416 Radiculopathy, lumbar region: Secondary | ICD-10-CM | POA: Insufficient documentation

## 2021-07-31 DIAGNOSIS — M5136 Other intervertebral disc degeneration, lumbar region: Secondary | ICD-10-CM | POA: Insufficient documentation

## 2021-07-31 NOTE — Telephone Encounter (Signed)
LVM for patient to call back to get an appt scheduled with Dr. Darene Lamer. AM ?

## 2021-07-31 NOTE — Progress Notes (Signed)
Early degenerative changes seen in lumbar spine. Some bone spurring. Question if these early changes could be causing your right leg weakness. While you are waiting for appt with neurology could also consider a consult with Dr. Darene Lamer here in office who specializes in sports medicine/ortho.

## 2021-08-04 ENCOUNTER — Other Ambulatory Visit (HOSPITAL_BASED_OUTPATIENT_CLINIC_OR_DEPARTMENT_OTHER): Payer: Self-pay

## 2021-08-04 ENCOUNTER — Other Ambulatory Visit: Payer: Self-pay

## 2021-08-04 ENCOUNTER — Ambulatory Visit (INDEPENDENT_AMBULATORY_CARE_PROVIDER_SITE_OTHER): Payer: 59 | Admitting: Sports Medicine

## 2021-08-04 DIAGNOSIS — M5416 Radiculopathy, lumbar region: Secondary | ICD-10-CM | POA: Diagnosis not present

## 2021-08-04 MED ORDER — PREDNISONE 50 MG PO TABS
ORAL_TABLET | ORAL | 0 refills | Status: DC
  Filled 2021-08-04: qty 5, 5d supply, fill #0

## 2021-08-04 NOTE — Progress Notes (Signed)
? ? ?  Procedures performed today:   ? ?None. ? ?Independent interpretation of notes and tests performed by another provider:  ? ?None. ? ?Brief History, Exam, Impression, and Recommendations:   ? ?Right lumbar radiculopathy ?Pleasant 61 year old female, increasing axial back pain that is discogenic with radiation down the right leg, with weakness in the right leg and increasing falls. ?Paresthesias are in an L5 distribution, she has weakness to plantarflexion of the right foot. ?Positive straight leg raise. ?Because of the progressive weakness we will proceed with MRI of the lumbar spine as well as some steroids. ?Home conditioning given. ?Return to go over MRI results. ?She does have an appointment with neurology in May some time. ? ?For insurance coverage purposes the diagnosis is lumbar radiculopathy with progressive weakness as the reason we are getting an early MRI. ? ? ? ?___________________________________________ ?Gwen Her. Dianah Field, M.D., ABFM., CAQSM. ?Primary Care and Sports Medicine ?[facility] ? ?Adjunct Instructor of Family Medicine  ?[employer] ?

## 2021-08-04 NOTE — Assessment & Plan Note (Signed)
Pleasant 61 year old female, increasing axial back pain that is discogenic with radiation down the right leg, with weakness in the right leg and increasing falls. ?Paresthesias are in an L5 distribution, she has weakness to plantarflexion of the right foot. ?Positive straight leg raise. ?Because of the progressive weakness we will proceed with MRI of the lumbar spine as well as some steroids. ?Home conditioning given. ?Return to go over MRI results. ?She does have an appointment with neurology in May some time. ? ?For insurance coverage purposes the diagnosis is lumbar radiculopathy with progressive weakness as the reason we are getting an early MRI. ?

## 2021-08-07 ENCOUNTER — Other Ambulatory Visit: Payer: 59

## 2021-08-08 ENCOUNTER — Other Ambulatory Visit: Payer: Self-pay

## 2021-08-08 ENCOUNTER — Ambulatory Visit (INDEPENDENT_AMBULATORY_CARE_PROVIDER_SITE_OTHER): Payer: 59

## 2021-08-08 ENCOUNTER — Encounter: Payer: Self-pay | Admitting: Sports Medicine

## 2021-08-08 DIAGNOSIS — M545 Low back pain, unspecified: Secondary | ICD-10-CM | POA: Diagnosis not present

## 2021-08-08 DIAGNOSIS — G8929 Other chronic pain: Secondary | ICD-10-CM | POA: Diagnosis not present

## 2021-08-08 DIAGNOSIS — R531 Weakness: Secondary | ICD-10-CM | POA: Diagnosis not present

## 2021-08-08 DIAGNOSIS — M5416 Radiculopathy, lumbar region: Secondary | ICD-10-CM | POA: Diagnosis not present

## 2021-08-08 DIAGNOSIS — M79604 Pain in right leg: Secondary | ICD-10-CM | POA: Diagnosis not present

## 2021-08-08 IMAGING — MR MR LUMBAR SPINE W/O CM
4 of 5 series · 25 of 48 positions shown · non-contrast
Comparison: X-ray lumbar [DATE].

CLINICAL DATA: Low back pain; new symptoms right L5 radiculopathy.
Chronic low back and right leg pain with weakness for 4 years.
Patient reports multiple falls in the last 5 months.

EXAM:
MRI LUMBAR SPINE WITHOUT CONTRAST
TECHNIQUE: Multiplanar, multisequence MR imaging of the lumbar spine was
performed. No intravenous contrast was administered.

[Series 2: T2 · sagittal · 4.0mm · 0.81mm/px · 6 of 15 slices shown (1 of 2)]
[im 1/15]
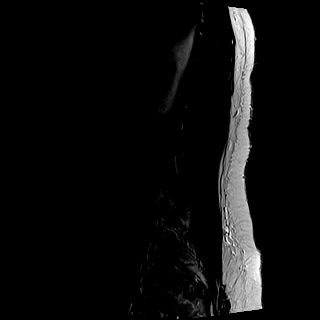
[im 3/15]
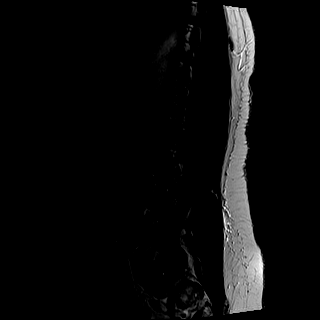
[im 6/15]
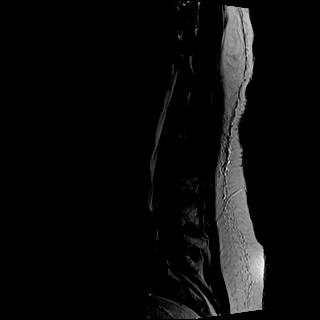
[im 9/15]
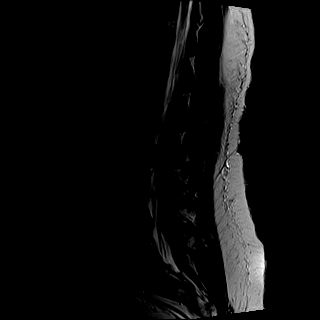
[im 12/15]
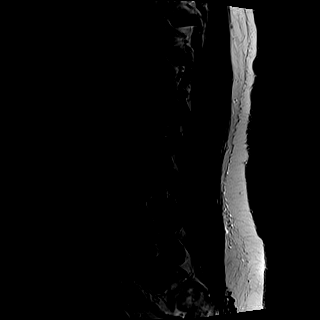
[im 15/15]
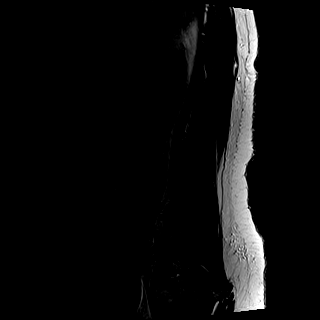

[Series 3: T1 · sagittal · 4.0mm · 0.41mm/px · 6 of 15 slices shown (1 of 2)]
[im 1/15]
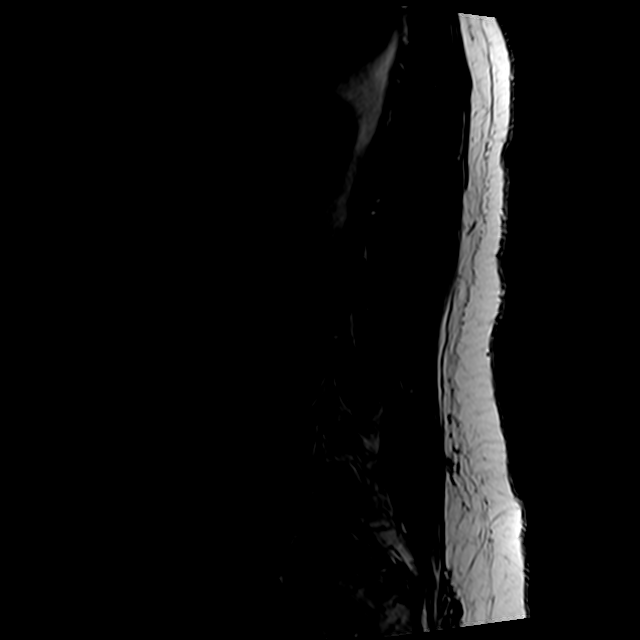
[im 3/15]
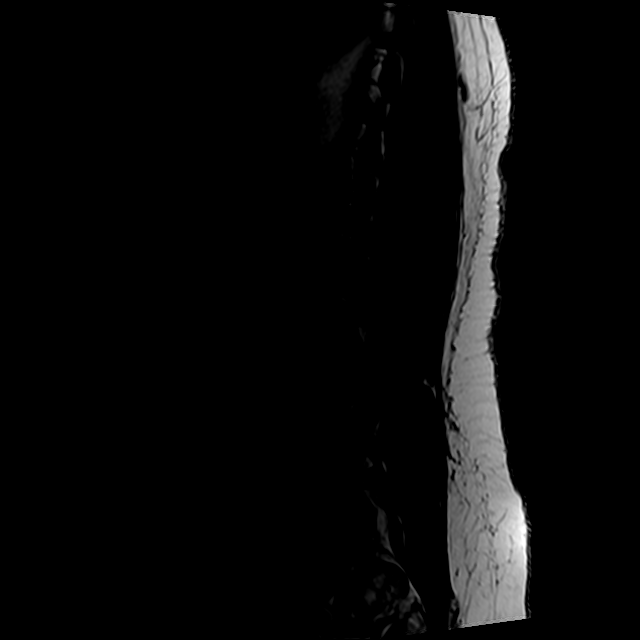
[im 6/15]
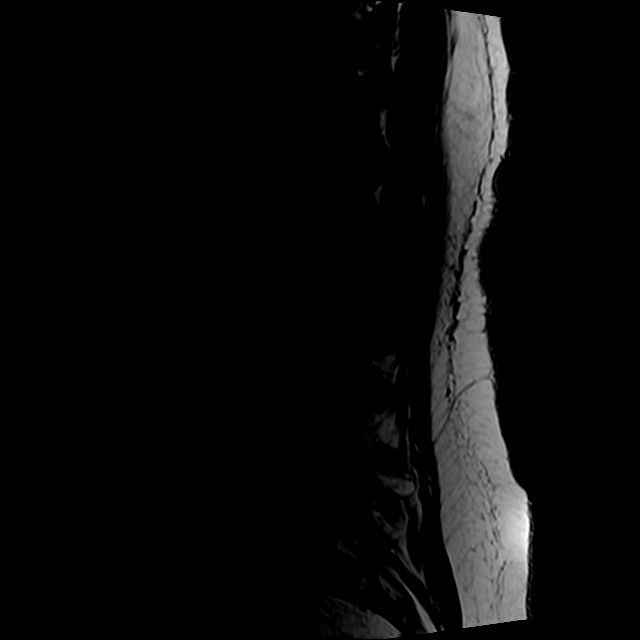
[im 9/15]
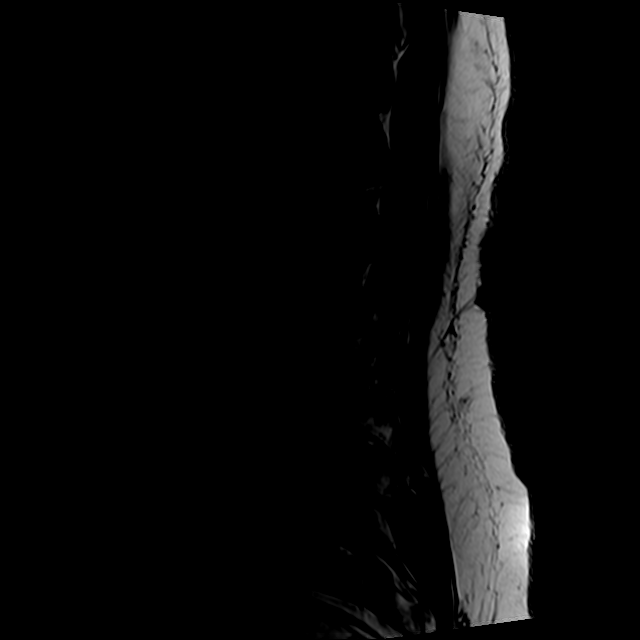
[im 12/15]
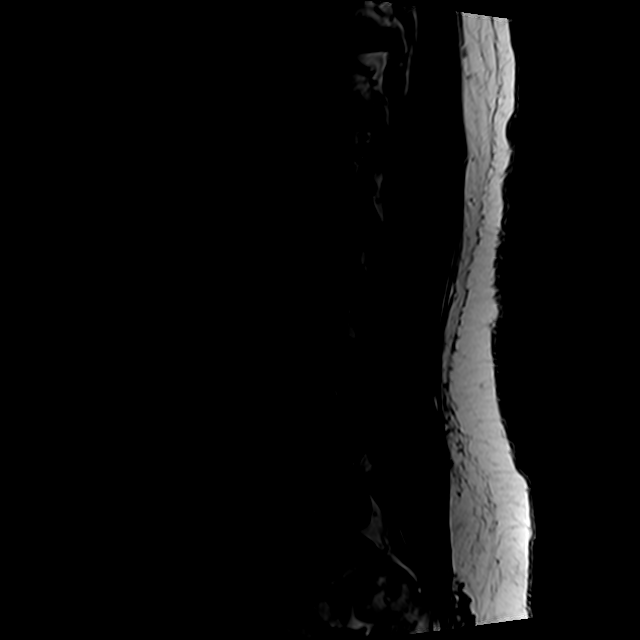
[im 15/15]
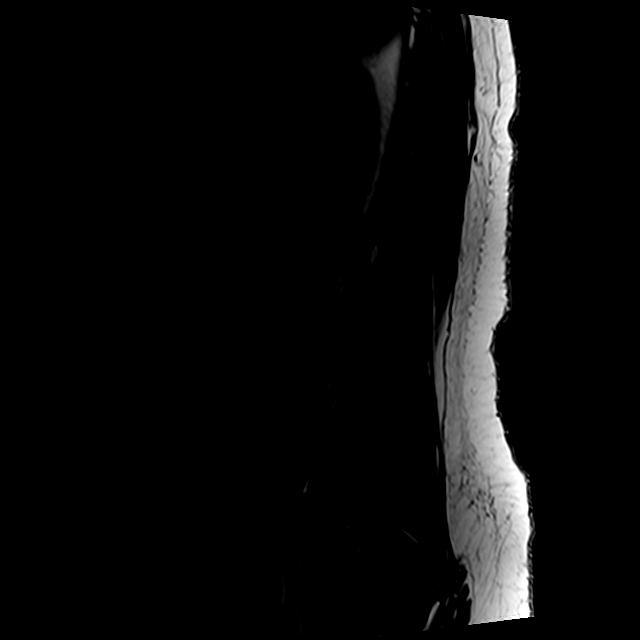

[Series 5: T2 · axial · 4.0mm · 0.78mm/px · z∈[-85,+122]mm · 9 of 38 slices shown (2 of 2)]
[im 1/38]
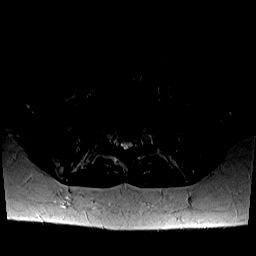
[im 6/38]
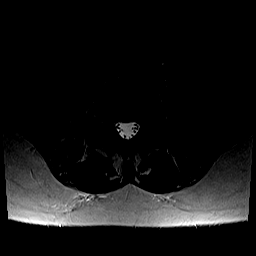
[im 11/38]
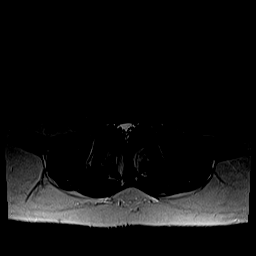
[im 16/38]
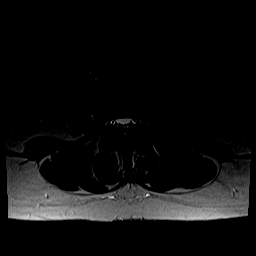
[im 19/38]
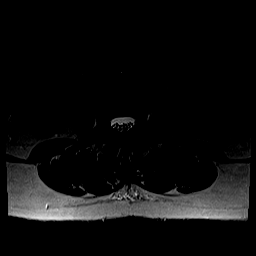
[im 22/38]
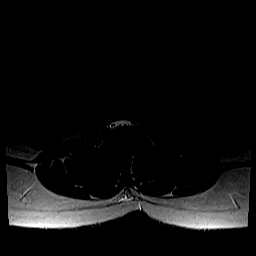
[im 27/38]
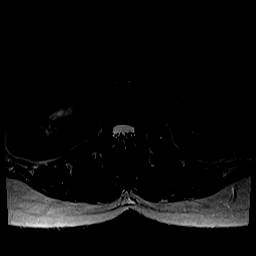
[im 32/38]
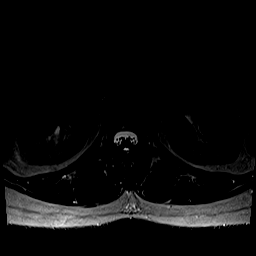
[im 38/38]
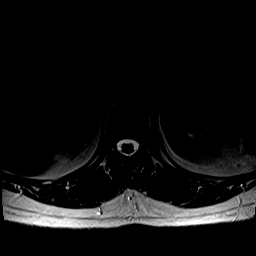

[Series 6: T1 · axial · 4.0mm · 0.39mm/px · z∈[-85,+92]mm · 4 of 38 slices shown (2 of 2)]
[im 1/38]
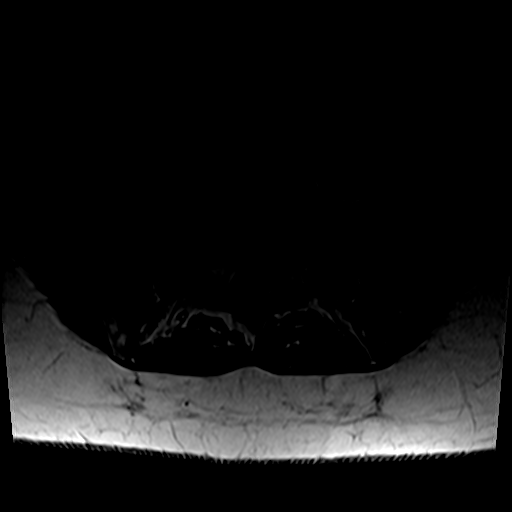
[im 6/38]
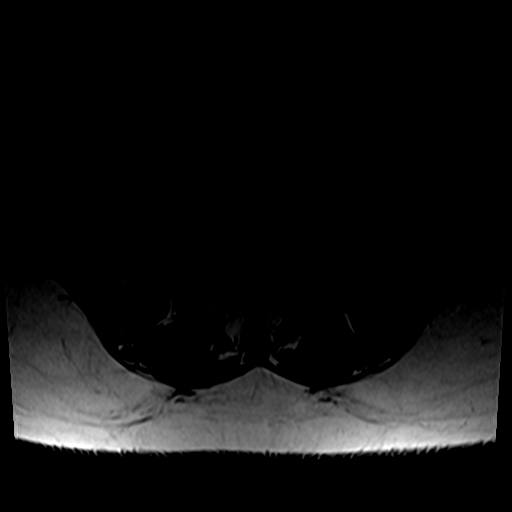
[im 19/38]
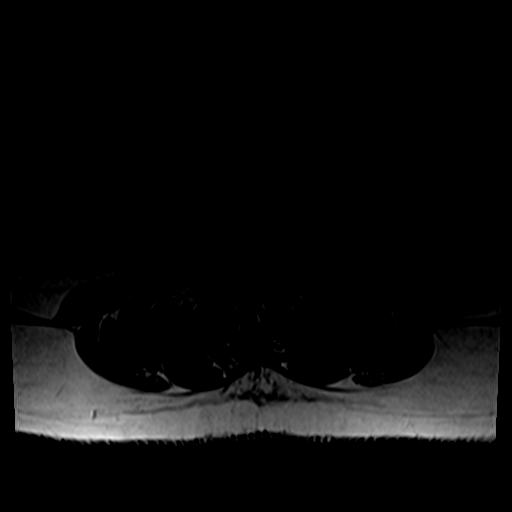
[im 32/38]
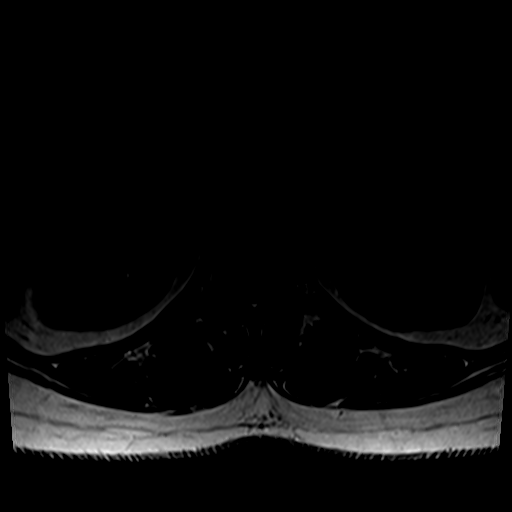

[25 of 48 positions shown; findings below may reference images not displayed]

FINDINGS: Segmentation:  Standard.

Alignment:  Physiologic.

Vertebrae:  No fracture, evidence of discitis, or bone lesion.

Conus medullaris and cauda equina: Conus extends to the L1-L2 level.
Conus and cauda equina appear normal.

Paraspinal and other soft tissues: Negative.

Disc levels:

T12-L1: No significant disc bulge. No neural foraminal stenosis. No
central canal stenosis.

L1-L2: Mild disc bulge. No neural foraminal stenosis. No central
canal stenosis.

L2-L3: Moderate disc protrusion and ligamentum flavum hypertrophy.
Narrowing of bilateral lateral recesses. No significant neural
foraminal narrowing.

L3-L4: Mild disc bulge and ligamentum flavum hypertrophy with
narrowing of spinal canal. No significant neural foraminal
narrowing. Mild bilateral facet joint arthropathy.

L4-L5: Broad-based disc bulge with narrowing of bilateral lateral
recesses and encroachment of the L5 nerve roots. Ligamentum flavum
hypertrophy with narrowing of spinal canal. Moderate facet joint
hypertrophy. No significant neural foraminal narrowing.

L5-S1: Asymmetric disc bulge to the right with mild right neural
foraminal stenosis. Ligamentum flavum hypertrophy. No significant
spinal canal stenosis. Moderate facet joint arthropathy.
IMPRESSION: 1.  No evidence of fracture or subluxation.

2. Visualized distal cord and cauda equina are within normal limits.

3. Multilevel degenerate disc disease most prominent at L4-L5 and
L5-S1 with narrowing of lateral recesses and encroachment of the
descending nerve roots. Mild right L5 neural foraminal stenosis.

## 2021-09-12 ENCOUNTER — Encounter: Payer: Self-pay | Admitting: Physician Assistant

## 2021-09-12 ENCOUNTER — Ambulatory Visit (INDEPENDENT_AMBULATORY_CARE_PROVIDER_SITE_OTHER): Payer: 59 | Admitting: Physician Assistant

## 2021-09-12 ENCOUNTER — Other Ambulatory Visit (HOSPITAL_BASED_OUTPATIENT_CLINIC_OR_DEPARTMENT_OTHER): Payer: Self-pay

## 2021-09-12 VITALS — BP 159/75 | HR 56 | Ht 62.0 in | Wt 159.0 lb

## 2021-09-12 DIAGNOSIS — I10 Essential (primary) hypertension: Secondary | ICD-10-CM

## 2021-09-12 DIAGNOSIS — Z1231 Encounter for screening mammogram for malignant neoplasm of breast: Secondary | ICD-10-CM

## 2021-09-12 DIAGNOSIS — E663 Overweight: Secondary | ICD-10-CM | POA: Diagnosis not present

## 2021-09-12 DIAGNOSIS — Z23 Encounter for immunization: Secondary | ICD-10-CM

## 2021-09-12 MED ORDER — LOSARTAN POTASSIUM 100 MG PO TABS
100.0000 mg | ORAL_TABLET | Freq: Every day | ORAL | 0 refills | Status: AC
Start: 2021-09-12 — End: ?
  Filled 2021-09-12: qty 90, 90d supply, fill #0

## 2021-09-12 MED ORDER — BUPROPION HCL ER (SR) 100 MG PO TB12
100.0000 mg | ORAL_TABLET | Freq: Two times a day (BID) | ORAL | 2 refills | Status: AC
  Filled 2021-09-12: qty 60, 30d supply, fill #0
  Filled 2021-10-22: qty 60, 30d supply, fill #1

## 2021-09-12 NOTE — Progress Notes (Signed)
? ?Subjective:  ? ? Patient ID: Melanie Hart, female    DOB: Jan 31, 1961, 61 y.o.   MRN: [id number] ? ?HPI ?Pt is a 61 yo female who presents to the clinic to follow up on HTN. Pt is taking her cozaar '50mg'$  daily. No side effects. Not checking BP regularly. No CP, palpitations, headaches or vision changes. She would like to lose weight. She feels like that has a lot of do with her BP issues. She is walking some. She loves sugar.  ? ?.. ?Active Ambulatory Problems  ?  Diagnosis Date Noted  ? Depression 10/26/2013  ? Stress at home 10/26/2013  ? Essential hypertension, benign 10/26/2013  ? Allergic rhinitis 10/26/2013  ? Overweight (BMI 25.0-29.9) 02/19/2014  ? Hypertriglyceridemia 05/02/2014  ? Double vision 01/24/2015  ? QT prolongation 01/28/2015  ? Cystocele 08/22/2015  ? Absence of bladder continence 08/22/2015  ? Hematuria 08/22/2015  ? Vaginal dryness, menopausal 08/22/2015  ? Dyslipidemia 06/11/2016  ? Left hip pain 06/12/2016  ? Paresthesia 08/08/2017  ? Vitamin D deficiency 08/08/2017  ? Seborrheic keratosis 08/08/2017  ? Leg pain, right 09/24/2017  ? Muscle cramps 09/24/2017  ? Incontinence of feces with fecal urgency 09/24/2017  ? Adenomatous colon polyp 10/17/2018  ? Hemorrhage of colon following colonoscopy 02/27/2019  ? Headache 09/25/2019  ? Systolic murmur 26/37/8588  ? Abnormal MRI of head 10/20/2019  ? Numbness 11/19/2019  ? Migraine with aura and without status migrainosus, not intractable 11/22/2020  ? Left sided sciatica 11/22/2020  ? Frequent falls 07/28/2021  ? Right leg weakness 07/28/2021  ? Chronic right-sided low back pain with right-sided sciatica 07/28/2021  ? Right lumbar radiculopathy 07/31/2021  ? ?Resolved Ambulatory Problems  ?  Diagnosis Date Noted  ? No Resolved Ambulatory Problems  ? ?Past Medical History:  ?Diagnosis Date  ? Allergy   ? Arthritis   ? Hypertension   ? ? ? ? ?Review of Systems  ?All other systems reviewed and are negative. ? ?   ?Objective:  ? Physical Exam ?Vitals  reviewed.  ?Constitutional:   ?   Appearance: Normal appearance. She is obese.  ?HENT:  ?   Head: Normocephalic.  ?Neck:  ?   Vascular: No carotid bruit.  ?Cardiovascular:  ?   Rate and Rhythm: Normal rate and regular rhythm.  ?   Pulses: Normal pulses.  ?   Heart sounds: Normal heart sounds.  ?Pulmonary:  ?   Effort: Pulmonary effort is normal.  ?   Breath sounds: Normal breath sounds.  ?Musculoskeletal:  ?   Right lower leg: No edema.  ?   Left lower leg: No edema.  ?Lymphadenopathy:  ?   Cervical: No cervical adenopathy.  ?Neurological:  ?   General: No focal deficit present.  ?   Mental Status: She is alert and oriented to person, place, and time.  ?Psychiatric:     ?   Mood and Affect: Mood normal.  ? ? ? ? ? ?   ?Assessment & Plan:  ?..Melanie Hart was seen today for follow-up. ? ?Diagnoses and all orders for this visit: ? ?Essential hypertension, benign ?-     losartan (COZAAR) 100 MG tablet; Take 1 tablet (100 mg total) by mouth daily. ? ?Encounter for screening mammogram for malignant neoplasm of breast ?-     MM 3D SCREEN BREAST BILATERAL ? ?Need for Tdap vaccination ?-     Tdap vaccine greater than or equal to 7yo IM ? ?Overweight (BMI 25.0-29.9) ?-  buPROPion ER (WELLBUTRIN SR) 100 MG 12 hr tablet; Take 1 tablet (100 mg total) by mouth 2 (two) times daily. ? ? ?BP not to goal.  ?Increased cozaar to '100mg'$  daily ?Recheck in 4 weeks ? ?[name].Discussed low carb diet with 1500 calories and 80g of protein.  ?Exercising at least 150 minutes a week.  ?[employer] could be a Microbiologist.  ?Start wellbutrin bid.  ? ?

## 2021-09-14 ENCOUNTER — Other Ambulatory Visit (HOSPITAL_BASED_OUTPATIENT_CLINIC_OR_DEPARTMENT_OTHER): Payer: Self-pay

## 2021-09-26 ENCOUNTER — Encounter: Payer: Self-pay | Admitting: Neurology

## 2021-09-26 ENCOUNTER — Other Ambulatory Visit (HOSPITAL_BASED_OUTPATIENT_CLINIC_OR_DEPARTMENT_OTHER): Payer: Self-pay

## 2021-09-26 ENCOUNTER — Ambulatory Visit: Payer: 59 | Admitting: Neurology

## 2021-09-26 VITALS — BP 165/87 | HR 70 | Ht 62.0 in | Wt 157.0 lb

## 2021-09-26 DIAGNOSIS — M5416 Radiculopathy, lumbar region: Secondary | ICD-10-CM | POA: Diagnosis not present

## 2021-09-26 DIAGNOSIS — G8929 Other chronic pain: Secondary | ICD-10-CM

## 2021-09-26 DIAGNOSIS — R519 Headache, unspecified: Secondary | ICD-10-CM | POA: Diagnosis not present

## 2021-09-26 DIAGNOSIS — M79604 Pain in right leg: Secondary | ICD-10-CM | POA: Diagnosis not present

## 2021-09-26 DIAGNOSIS — H532 Diplopia: Secondary | ICD-10-CM

## 2021-09-26 DIAGNOSIS — R930 Abnormal findings on diagnostic imaging of skull and head, not elsewhere classified: Secondary | ICD-10-CM | POA: Diagnosis not present

## 2021-09-26 MED ORDER — ETODOLAC 400 MG PO TABS
400.0000 mg | ORAL_TABLET | Freq: Two times a day (BID) | ORAL | 5 refills | Status: AC
  Filled 2021-09-26: qty 60, 30d supply, fill #0

## 2021-09-26 NOTE — Progress Notes (Signed)
? ?[facility] ? ?PATIENT: Melanie Hart ?DOB: [birth year] ? ?REFERRING DOCTOR OR PCP: Iran Planas PA-C ?SOURCE: Patient, notes from primary care, laboratory reports, imaging reports, MRI images personally reviewed. ? ?_________________________________ ? ? ?HISTORICAL ? ?CHIEF COMPLAINT:  ?Chief Complaint  ?Patient presents with  ? Follow-up  ?  Rm 2, alone. Pt referred for R leg weakness and frequent falls. Ongoing for last 2-3 years. Has had PT.  Had xray and MRI's done. Pt exercises and working on strengthening her abdominal muscles to help.   ? ? ?HISTORY OF PRESENT ILLNESS:  ?Melanie Hart,is a 61 year old woman with diplopia, headaches and numbness and an abnormal brain MRI. ? ? ?UPDATE 09/26/2021: ?She is reporting right leg pain.  Pain is in the leg but occasionally lower back t leg.  Sensation is like a cramp.    She feels the leg is weak and she has fallen, especially on uneven surfaces.   This has been going on x 2-3 years but is worse.     Sitting increase the pain.   MRI shows DDD/DJD at [place], :4L5 and L5S1.   Flexeril helps the pain some but makes her sleepy.    A steroid pack erlier this year had not helped.     ? ?I had previously seen her for headaches and diplopia and an abnormal brain MRI.    She continues to have right monocular diplopia (has had since 2018 or so).   She has seen neuro-ophthalmology and was told it may be due to her cornea.     Headaches are occurring most days the last month but were less frequent most of the time (couple/week).     They are mild to moderate most days.     If more intense, she takes an NSAID.   Due to the HA's she had MRI in past showing some white matter changes.   These were stable on subsequent MRI.    ? ?IMAGING: ?MRI lumbar shows disc protrusion at L4L5 causing mild to moderate right foraminal narrowing and mild to moderate bilateral lateral recess stenosis.  There is a disc bulge to the right at L5S1.   No actual nerve root compression.    She has facet hypertrophy in lower lumbar spine.  ?   ?MRI of the brain 10/11/20 shows scattered T2/FLAIR hyperintense foci predominantly in the subcortical and deep white matter.  None of the foci appear to be acute and they do not enhance.  I discussed with her that the pattern is most typical for chronic microvascular ischemic change or sequelae of migraine.   No change compared to 09/2019 ? ? ?Vascular risk:   She has HTN (mild).  She never smoked and does not have DM.  She was once told she had scleroderma in the right arm.   She sleeps well at night.   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    ? ? ? ?REVIEW OF SYSTEMS: ?Constitutional: No fevers, chills, sweats, or change in appetite ?Eyes: No visual changes, double vision, eye pain ?Ear, nose and throat: No hearing loss, ear pain, nasal congestion, sore throat ?Cardiovascular: No chest pain, palpitations ?Respiratory:  No shortness of breath at rest or with exertion.   No wheezes ?GastrointestinaI: No nausea, vomiting, diarrhea, abdominal pain, fecal incontinence ?Genitourinary:  No dysuria, urinary retention or frequency.  No nocturia. ?Musculoskeletal:  No neck pain, back pain ?Integumentary: No rash, pruritus, skin lesions ?Neurological: as above ?Psychiatric: No depression at this time.  No anxiety ?Endocrine: No palpitations, diaphoresis, change in appetite, change in weigh or increased thirst ?Hematologic/Lymphatic:  No anemia, purpura, petechiae. ?Allergic/Immunologic: She has seasonal allergies. ? ?ALLERGIES: ?Allergies  ?Allergen Reactions  ? Contrave [Naltrexone-Bupropion Hcl Er]   ?  Dry mouth/constipation  ? Lisinopril   ?  Did not like the way it made her feel.   ? ? ?HOME MEDICATIONS: ? ?Current Outpatient Medications:  ?  buPROPion ER (WELLBUTRIN SR) 100 MG 12 hr tablet, Take 1 tablet (100 mg total) by mouth 2 (two) times daily., Disp: 60 tablet, Rfl: 2 ?  CALCIUM-MAGNESUIUM-ZINC 333-133-8.3 MG TABS, , Disp: , Rfl:  ?  Coenzyme Q10 (CO Q-10) 400 MG  CAPS, , Disp: , Rfl:  ?  cyclobenzaprine (FLEXERIL) 5 MG tablet, Take 1 tablet (5 mg total) by mouth 3 (three) times daily as needed for muscle spasms., Disp: 15 tablet, Rfl: 2 ?  etodolac (LODINE) 400 MG tablet, Take 1 tablet (400 mg total) by mouth 2 (two) times daily., Disp: 60 tablet, Rfl: 5 ?  fexofenadine (SM FEXOFENADINE HCL) 180 MG tablet, TAKE 1 TABLET (180 MG TOTAL) BY MOUTH DAILY., Disp: 90 tablet, Rfl: 0 ?  losartan (COZAAR) 100 MG tablet, Take 1 tablet (100 mg total) by mouth daily., Disp: 90 tablet, Rfl: 0 ?  Omega-3 Fatty Acids (FISH OIL PO), Take by mouth., Disp: , Rfl:  ?  triamcinolone (NASACORT) 55 MCG/ACT AERO nasal inhaler, Place 2 sprays into the nose daily., Disp: 16.9 mL, Rfl: 1 ? ?PAST MEDICAL HISTORY: ?Past Medical History:  ?Diagnosis Date  ? Allergy   ? seasonal  ? Arthritis   ? hips,hands  ? Hemorrhage of colon following colonoscopy 02/27/2019  ? Hypertension   ? not currently on meds  ? ? ?PAST SURGICAL HISTORY: ?Past Surgical History:  ?Procedure Laterality Date  ? BLADDER REPAIR  2007  ? COLONOSCOPY    ? FLEXIBLE SIGMOIDOSCOPY N/A 02/27/2019  ? Procedure: FLEXIBLE SIGMOIDOSCOPY;  Surgeon: Gatha Mayer, MD;  [facility] ENDOSCOPY;  Service: Endoscopy;  Laterality: N/A;  ? HEMOSTASIS CLIP PLACEMENT  02/27/2019  ? Procedure: HEMOSTASIS CLIP PLACEMENT;  Surgeon: Gatha Mayer, MD;  Location: [place];  Service: Endoscopy;;  ? INCONTINENCE SURGERY    ? wisdom teeth    ? ? ?FAMILY HISTORY: ?Family History  ?Problem Relation Age of Onset  ? Hypertension Mother   ? Colon polyps Mother 52  ?     pre-cancer polyps  ? Hypertension Sister   ? Hyperlipidemia Brother   ? Hypertension Brother   ? Esophageal cancer Neg Hx   ? Rectal cancer Neg Hx   ? Stomach cancer Neg Hx   ? Colon cancer Neg Hx   ? ? ?SOCIAL HISTORY: ? ?Social History  ? ?Socioeconomic History  ? Marital status: Married  ?  Spouse name: Delfino Lovett   ? Number of children: 2  ? Years of education: Not on file  ? Highest  education level: Bachelor's degree (e.g., BA, AB, BS)  ?Occupational History  ? Occupation: [employer]  ?  Employer: [employer]  ?Tobacco Use  ? Smoking status: Never  ? Smokeless tobacco: Never  ?Vaping Use  ? Vaping Use: Never used  ?Substance and Sexual Activity  ? Alcohol use: Never  ? Drug use: No  ? Sexual activity: Yes  ?Other Topics Concern  ? Not on file  ?Social History Narrative  ? Lives with husband and daughter in college  ? Caffeine use: coffee (about 4-5 cups  per day)  ? Right handed   ? Married  ? 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 ?Social Determinants of Health  ? ?Financial Resource Strain: Not on file  ?Food Insecurity: Not on file  ?Transportation Needs: Not on file  ?Physical Activity: Not on file  ?Stress: Not on file  ?Social Connections: Not on file  ?Intimate Partner Violence: Not on file  ? ? ? ?PHYSICAL EXAM ? ?Vitals:  ? 09/26/21 1417  ?BP: (!) 165/87  ?Pulse: 70  ?Weight: 157 lb (71.2 kg)  ?Height: '5\' 2"'$  (1.575 m)  ? ? ? ?Body mass index is 28.72 kg/m?. ? ? ?General: The patient is well-developed and well-nourished and in no acute distress.  No neck tenderness.  Good range of motion of the neck. ? ?HEENT:  Head is [employer]/AT.  Sclera are anicteric.   ? ?Skin: Extremities are without rash or  edema. ? ?Neurologic Exam ? ?Mental status: The patient is alert and oriented x 3 at the time of the examination. The patient has apparent normal recent and remote memory, with an apparently normal attention span and concentration ability.   Speech is normal. ? ?Cranial nerves: Extraocular movements are full. Pupils are equal, round, and reactive to light and accomodation.  Facial strength and sensation was normal.  No obvious hearing deficits are noted. ? ?Motor:  Muscle bulk is normal.   Tone is normal. Strength is  5 / 5 in all 4 extremities.  Functional testing shows that she can squat and rise without difficulty.  She does not need to use her hands to get out of a chair.  She can stand  on her heels and toes. ? ?Sensory: Sensory testing is intact to pinprick, soft touch and vibration sensation in all 4 extremities. ? ?Coordination: Cerebellar testing reveals good finger-nose-finger and heel-to-sh

## 2021-10-19 ENCOUNTER — Other Ambulatory Visit (HOSPITAL_BASED_OUTPATIENT_CLINIC_OR_DEPARTMENT_OTHER): Payer: Self-pay

## 2021-10-19 ENCOUNTER — Encounter: Payer: Self-pay | Admitting: Physician Assistant

## 2021-10-24 ENCOUNTER — Other Ambulatory Visit (HOSPITAL_BASED_OUTPATIENT_CLINIC_OR_DEPARTMENT_OTHER): Payer: Self-pay

## 2021-11-16 ENCOUNTER — Ambulatory Visit: Payer: 59

## 2021-12-12 ENCOUNTER — Ambulatory Visit: Payer: 59 | Admitting: Physician Assistant

## 2022-08-10 ENCOUNTER — Telehealth: Payer: Self-pay | Admitting: General Practice

## 2022-08-10 NOTE — Transitions of Care (Post Inpatient/ED Visit) (Signed)
   08/10/2022  Name: Melanie Hart MRN: [id number] DOB: 04/25/1961  Today's TOC FU Call Status: Today's TOC FU Call Status:: Successful TOC FU Call Competed TOC FU Call Complete Date: 08/10/22  Patient stated that she has a different PCP now. Removed Iran Planas as PCP.   SIGNATURE:  Tinnie Gens, RN BSN

## 2024-01-28 ENCOUNTER — Encounter: Payer: Self-pay | Admitting: Sports Medicine
